# Patient Record
Sex: Female | Born: 1970 | Race: Black or African American | Hispanic: No | Marital: Married | Smoking: Never smoker
Health system: Southern US, Community
[De-identification: ages and names within clinical notes are randomized; demographics above are authoritative.]

## PROBLEM LIST (undated history)

## (undated) DIAGNOSIS — IMO0002 Reserved for concepts with insufficient information to code with codable children: Secondary | ICD-10-CM

## (undated) DIAGNOSIS — I011 Acute rheumatic endocarditis: Secondary | ICD-10-CM

## (undated) DIAGNOSIS — M3500 Sicca syndrome, unspecified: Secondary | ICD-10-CM

## (undated) DIAGNOSIS — I2699 Other pulmonary embolism without acute cor pulmonale: Secondary | ICD-10-CM

## (undated) DIAGNOSIS — M069 Rheumatoid arthritis, unspecified: Secondary | ICD-10-CM

## (undated) DIAGNOSIS — M329 Systemic lupus erythematosus, unspecified: Secondary | ICD-10-CM

## (undated) HISTORY — PX: TONSILLECTOMY: SUR1361

## (undated) HISTORY — DX: Sjogren syndrome, unspecified: M35.00

## (undated) HISTORY — DX: Systemic lupus erythematosus, unspecified: M32.9

## (undated) HISTORY — DX: Rheumatoid arthritis, unspecified: M06.9

---

## 2004-06-25 HISTORY — PX: TUBAL LIGATION: SHX77

## 2013-06-03 DIAGNOSIS — I2699 Other pulmonary embolism without acute cor pulmonale: Secondary | ICD-10-CM | POA: Insufficient documentation

## 2016-09-24 ENCOUNTER — Emergency Department (HOSPITAL_COMMUNITY)
Admission: EM | Admit: 2016-09-24 | Discharge: 2016-09-24 | Disposition: A | Discharge status: Home / Self Care | Attending: Emergency Medicine | Admitting: Emergency Medicine

## 2016-09-24 ENCOUNTER — Encounter (HOSPITAL_COMMUNITY): Payer: Self-pay | Admitting: Emergency Medicine

## 2016-09-24 DIAGNOSIS — N3000 Acute cystitis without hematuria: Secondary | ICD-10-CM | POA: Diagnosis not present

## 2016-09-24 DIAGNOSIS — Z79899 Other long term (current) drug therapy: Secondary | ICD-10-CM | POA: Diagnosis not present

## 2016-09-24 DIAGNOSIS — R350 Frequency of micturition: Secondary | ICD-10-CM | POA: Diagnosis present

## 2016-09-24 HISTORY — DX: Reserved for concepts with insufficient information to code with codable children: IMO0002

## 2016-09-24 HISTORY — DX: Systemic lupus erythematosus, unspecified: M32.9

## 2016-09-24 HISTORY — DX: Acute rheumatic endocarditis: I01.1

## 2016-09-24 HISTORY — DX: Other pulmonary embolism without acute cor pulmonale: I26.99

## 2016-09-24 LAB — URINALYSIS, ROUTINE W REFLEX MICROSCOPIC
BILIRUBIN URINE: NEGATIVE
Glucose, UA: NEGATIVE mg/dL
HGB URINE DIPSTICK: NEGATIVE
KETONES UR: NEGATIVE mg/dL
Nitrite: NEGATIVE
PROTEIN: NEGATIVE mg/dL
SPECIFIC GRAVITY, URINE: 1.024 (ref 1.005–1.030)
pH: 5 (ref 5.0–8.0)

## 2016-09-24 MED ORDER — CIPROFLOXACIN HCL 500 MG PO TABS: 500.0000 mg | ORAL_TABLET | Freq: Two times a day (BID) | ORAL | 0 refills | Status: DC

## 2016-09-24 MED ORDER — PHENAZOPYRIDINE HCL 200 MG PO TABS: 200.0000 mg | ORAL_TABLET | Freq: Three times a day (TID) | ORAL | 0 refills | Status: DC

## 2016-09-24 NOTE — ED Provider Notes (Signed)
[facility]Y DEPT Provider Note   CSN: [id number] Arrival date & time: 09/24/16  1752  By signing my name below, I, Modena Jansky, attest that this documentation has been prepared under the direction and in the presence of non-physician practitioner, Kerrie Buffalo, NP. Electronically Signed: Modena Jansky, Scribe. 09/24/2016. 7:31 PM.  History   Chief Complaint Chief Complaint  Patient presents with  . Recurrent UTI   The history is provided by the patient. No language interpreter was used.   HPI Comments: Shawna Stevens is a 46 y.o. female with a PMHx of UTI who presents to the Emergency Department complaining of urinary frequency that started about 2 days ago. She suspects a UTI due to prior of UTIs (resolved with Cipro). She reports associated urinary urgency, pressure sensation, and lower abdominal pain. She denies any dysuria or other complaints.   Past Medical History:  Diagnosis Date  . Lupus   . Pulmonary embolism (HCC)   . Rheumatoid aortitis     There are no active problems to display for this patient.   Past Surgical History:  Procedure Laterality Date  . TONSILLECTOMY      OB History    No data available       Home Medications    Prior to Admission medications   Medication Sig Start Date End Date Taking? Authorizing Provider  hydroxychloroquine (PLAQUENIL) 200 MG tablet Take 400 mg by mouth every evening.  08/27/16  Yes Historical Provider, MD  mirtazapine (REMERON) 15 MG tablet Take 15 mg by mouth at bedtime.   Yes Historical Provider, MD  Multiple Vitamin (MULTIVITAMIN WITH MINERALS) TABS tablet Take 1 tablet by mouth daily.   Yes Historical Provider, MD  nitrofurantoin (MACRODANTIN) 50 MG capsule Take 50 mg by mouth every Monday, Wednesday, and Friday. 07/31/16  Yes Historical Provider, MD    Family History History reviewed. No pertinent family history.  Social History Social History  Substance Use Topics  . Smoking status: Never Smoker  . Smokeless  tobacco: Never Used  . Alcohol use No     Allergies   Sulfa antibiotics   Review of Systems Review of Systems  Constitutional: Negative for chills and fever.  Gastrointestinal: Positive for abdominal pain (Lower).  Genitourinary: Positive for frequency and urgency. Negative for dysuria.  Musculoskeletal: Negative for back pain.  Skin: Negative for rash.  All other systems reviewed and are negative.    Physical Exam Updated Vital Signs BP 127/76 (BP Location: Right Arm)   Pulse 83   Temp 98.2 F (36.8 C) (Oral)   Resp 17   LMP 09/14/2016 (Exact Date)   SpO2 99%   Physical Exam  Constitutional: She appears well-developed and well-nourished. No distress.  HENT:  Head: Normocephalic.  Eyes: Conjunctivae are normal.  Neck: Neck supple.  Cardiovascular: Normal rate and regular rhythm.   Pulmonary/Chest: Effort normal. No respiratory distress. She has no wheezes. She has no rales.  Abdominal: Soft. Bowel sounds are normal. There is tenderness.  SP TTP.   Genitourinary:  Genitourinary Comments: No CVA tenderness.   Musculoskeletal: Normal range of motion.  Neurological: She is alert.  Skin: Skin is warm and dry.  Psychiatric: She has a normal mood and affect.  Nursing note and vitals reviewed.    ED Treatments / Results  DIAGNOSTIC STUDIES: Oxygen Saturation is 99% on RA, normal by my interpretation.    COORDINATION OF CARE: 7:35 PM- Pt advised of plan for treatment and pt agrees.  Labs (all labs ordered are listed,  but only abnormal results are displayed) Labs Reviewed  URINALYSIS, ROUTINE W REFLEX MICROSCOPIC - Abnormal; Notable for the following:       Result Value   Color, Urine AMBER (*)    APPearance HAZY (*)    Leukocytes, UA SMALL (*)    Bacteria, UA RARE (*)    Squamous Epithelial / LPF 6-30 (*)    All other components within normal limits  URINE CULTURE   Radiology No results found.  Procedures Procedures (including critical care  time)  Medications Ordered in ED Medications - No data to display   Initial Impression / Assessment and Plan / ED Course  I have reviewed the triage vital signs and the nursing notes.  Pertinent lab results that were available during my care of the patient were reviewed by me and considered in my medical decision making (see chart for details).  Pt has been diagnosed with a UTI. Pt is afebrile, no CVA tenderness, normotensive, and denies N/V. Pt to be dc home with antibiotics and instructions to follow up with PCP if symptoms persist. Urine sent for culture.  Final Clinical Impressions(s) / ED Diagnoses   Final diagnoses:  Acute cystitis without hematuria    New Prescriptions New Prescriptions   No medications on file   I personally performed the services described in this documentation, which was scribed in my presence. The recorded information has been reviewed and is accurate.     [address] [place], [place] 09/24/16 2056    Mancel Bale, MD 09/25/16 0157

## 2016-09-24 NOTE — ED Notes (Signed)
Pt ambulatory to waiting room. Pt verbalized understanding of discharge instructions.   

## 2016-09-24 NOTE — ED Triage Notes (Signed)
Pt states "Ishe has a history of recurrent UTIs and this feels like the others." Pt C/O of nausea X 2 days.

## 2016-09-27 LAB — URINE CULTURE

## 2016-09-28 ENCOUNTER — Telehealth: Payer: Self-pay

## 2016-09-28 NOTE — Telephone Encounter (Signed)
Post ED Visit - Positive Culture Follow-up  Culture report reviewed by antimicrobial stewardship pharmacist:   Enzo Bi, Pharm.D.  Celedonio Miyamoto, Pharm.D., BCPS AQ-ID  Garvin Fila, Pharm.D., BCPS  Georgina Pillion, Pharm.D., BCPS  [place], [address].D., BCPS, AAHIVP  Estella Husk, Pharm.D., BCPS, AAHIVP  Lysle Pearl, PharmD, BCPS  Casilda Carls, PharmD, BCPS  Pollyann Samples, PharmD, BCPS Katie cook Pharm D Positive urine culture Treated with Ciprofloxacin, organism sensitive to the same and no further patient follow-up is required at this time.  Jerry Caras 09/28/2016, 9:12 AM

## 2016-10-08 DIAGNOSIS — M0579 Rheumatoid arthritis with rheumatoid factor of multiple sites without organ or systems involvement: Secondary | ICD-10-CM | POA: Insufficient documentation

## 2016-10-08 DIAGNOSIS — M329 Systemic lupus erythematosus, unspecified: Secondary | ICD-10-CM | POA: Insufficient documentation

## 2016-10-08 DIAGNOSIS — Z86711 Personal history of pulmonary embolism: Secondary | ICD-10-CM | POA: Insufficient documentation

## 2016-10-08 NOTE — Progress Notes (Deleted)
   Office Visit Note  Patient: Shawna Stevens             Date of Birth: 05/20/1971           MRN: [id number]             PCP: No PCP Per Patient Referring: No ref. provider found Visit Date: 10/12/2016 Occupation: @GUAROCC@    Subjective:  No chief complaint on file.   History of Present Illness: Shawna Stevens is a 46 y.o. female ***   Activities of Daily Living:  Patient reports morning stiffness for *** {minute/hour:19697}.   Patient {ACTIONS;DENIES/REPORTS:21021675::"Denies"} nocturnal pain.  Difficulty dressing/grooming: {ACTIONS;DENIES/REPORTS:21021675::"Denies"} Difficulty climbing stairs: {ACTIONS;DENIES/REPORTS:21021675::"Denies"} Difficulty getting out of chair: {ACTIONS;DENIES/REPORTS:21021675::"Denies"} Difficulty using hands for taps, buttons, cutlery, and/or writing: {ACTIONS;DENIES/REPORTS:21021675::"Denies"}   No Rheumatology ROS completed.   PMFS History:  There are no active problems to display for this patient.   Past Medical History:  Diagnosis Date  . Lupus   . Pulmonary embolism (HCC)   . Rheumatoid aortitis     No family history on file. Past Surgical History:  Procedure Laterality Date  . TONSILLECTOMY     Social History   Social History Narrative  . No narrative on file     Objective: Vital Signs: LMP 09/14/2016 (Exact Date)    Physical Exam   Musculoskeletal Exam: ***  CDAI Exam: No CDAI exam completed.    Investigation: Findings:  04/20/2016 CBC normal, CMP normal alkaline phosphatase 125, C3 1 21, C4 normal 20, CH 14 normal 46, UA negative, ESR 14, CRP 4.6 normal, RF 67.8, CCP antibody greater than 250, 1433 eta negative, ANA 1:160 homogeneous, DS DNA 16, RNP positive, Ro positive, Smith negative, La negative, hep panel negative, TB Gold indeterminate    Imaging: No results found.  Speciality Comments: No specialty comments available.    Procedures:  No procedures performed Allergies: Sulfa antibiotics   Assessment /  Plan:     Visit Diagnoses: No diagnosis found.    Orders: No orders of the defined types were placed in this encounter.  No orders of the defined types were placed in this encounter.   Face-to-face time spent with patient was *** minutes. 50% of time was spent in counseling and coordination of care.  Follow-Up Instructions: No Follow-up on file.    , MD  Note - This record has been created using Dragon software.  Chart creation errors have been sought, but may not always  have been located. Such creation errors do not reflect on  the standard of medical care. 

## 2016-10-12 ENCOUNTER — Ambulatory Visit: Payer: Self-pay | Admitting: Rheumatology

## 2016-10-21 ENCOUNTER — Encounter (HOSPITAL_COMMUNITY): Payer: Self-pay | Admitting: *Deleted

## 2016-10-21 ENCOUNTER — Emergency Department (HOSPITAL_COMMUNITY)
Admission: EM | Admit: 2016-10-21 | Discharge: 2016-10-21 | Disposition: A | Discharge status: Home / Self Care | Attending: Emergency Medicine | Admitting: Emergency Medicine

## 2016-10-21 DIAGNOSIS — R112 Nausea with vomiting, unspecified: Secondary | ICD-10-CM

## 2016-10-21 DIAGNOSIS — R197 Diarrhea, unspecified: Secondary | ICD-10-CM | POA: Insufficient documentation

## 2016-10-21 DIAGNOSIS — Z79899 Other long term (current) drug therapy: Secondary | ICD-10-CM | POA: Diagnosis not present

## 2016-10-21 DIAGNOSIS — R109 Unspecified abdominal pain: Secondary | ICD-10-CM | POA: Insufficient documentation

## 2016-10-21 LAB — CBC WITH DIFFERENTIAL/PLATELET
BASOS ABS: 0 10*3/uL (ref 0.0–0.1)
BASOS PCT: 1 %
Eosinophils Absolute: 0.1 10*3/uL (ref 0.0–0.7)
Eosinophils Relative: 2 %
HEMATOCRIT: 38.1 % (ref 36.0–46.0)
HEMOGLOBIN: 12.8 g/dL (ref 12.0–15.0)
Lymphocytes Relative: 38 %
Lymphs Abs: 2 10*3/uL (ref 0.7–4.0)
MCH: 29.4 pg (ref 26.0–34.0)
MCHC: 33.6 g/dL (ref 30.0–36.0)
MCV: 87.6 fL (ref 78.0–100.0)
MONOS PCT: 11 %
Monocytes Absolute: 0.6 10*3/uL (ref 0.1–1.0)
NEUTROS ABS: 2.5 10*3/uL (ref 1.7–7.7)
NEUTROS PCT: 48 %
Platelets: 213 10*3/uL (ref 150–400)
RBC: 4.35 MIL/uL (ref 3.87–5.11)
RDW: 13.6 % (ref 11.5–15.5)
WBC: 5.2 10*3/uL (ref 4.0–10.5)

## 2016-10-21 LAB — BASIC METABOLIC PANEL
ANION GAP: 8 (ref 5–15)
BUN: 11 mg/dL (ref 6–20)
CHLORIDE: 105 mmol/L (ref 101–111)
CO2: 24 mmol/L (ref 22–32)
Calcium: 9.2 mg/dL (ref 8.9–10.3)
Creatinine, Ser: 0.73 mg/dL (ref 0.44–1.00)
GFR calc non Af Amer: >60 mL/min (ref >60)
Glucose, Bld: 95 mg/dL (ref 65–99)
Potassium: 3.6 mmol/L (ref 3.5–5.1)
Sodium: 137 mmol/L (ref 135–145)

## 2016-10-21 LAB — URINALYSIS, ROUTINE W REFLEX MICROSCOPIC
Bilirubin Urine: NEGATIVE
Glucose, UA: NEGATIVE mg/dL
Hgb urine dipstick: NEGATIVE
KETONES UR: NEGATIVE mg/dL
LEUKOCYTES UA: NEGATIVE
NITRITE: NEGATIVE
PH: 5 (ref 5.0–8.0)
Protein, ur: NEGATIVE mg/dL
SPECIFIC GRAVITY, URINE: 1.027 (ref 1.005–1.030)

## 2016-10-21 LAB — PREGNANCY, URINE: PREG TEST UR: NEGATIVE

## 2016-10-21 MED ORDER — ONDANSETRON HCL 4 MG/2ML IJ SOLN
4.0000 mg | Freq: Once | INTRAMUSCULAR | Status: AC
  Administered 2016-10-21: 4 mg via INTRAVENOUS
  Filled 2016-10-21: qty 2

## 2016-10-21 MED ORDER — ONDANSETRON 4 MG PO TBDP: 4.0000 mg | ORAL_TABLET | Freq: Three times a day (TID) | ORAL | 0 refills | Status: DC | PRN

## 2016-10-21 MED ORDER — SODIUM CHLORIDE 0.9 % IV BOLUS (SEPSIS)
1000.0000 mL | Freq: Once | INTRAVENOUS | Status: AC
  Administered 2016-10-21: 1000 mL via INTRAVENOUS

## 2016-10-21 MED ORDER — MIRTAZAPINE 15 MG PO TABS: 15.0000 mg | ORAL_TABLET | Freq: Every day | ORAL | 0 refills | Status: DC

## 2016-10-21 NOTE — ED Triage Notes (Signed)
Pt c/o nausea, abd cramping and diarrhea that started a few days ago,

## 2016-10-21 NOTE — ED Provider Notes (Signed)
[facility] Provider Note   CSN: [id number] Arrival date & time: 10/21/16  1908     History   Chief Complaint No chief complaint on file.   HPI Shawna Stevens is a 46 y.o. female.  HPI Patient has nausea vomiting diarrhea and abdominal pain. Began a few days ago. Decreased appetite. No sick contacts. No fevers. States she just feels bad. Pain is dull. Worse in the lower abdomen. States the pain was much more severe couple days ago. No dysuria. No vaginal bleeding or discharge. Has had previous urinary tract infections but states this does not feel like that. Also antibiotics for UTI around a month ago. Pain is worse with eating.   Past Medical History:  Diagnosis Date  . Lupus   . Pulmonary embolism (HCC)   . Rheumatoid aortitis     Patient Active Problem List   Diagnosis Date Noted  . Rheumatoid arthritis involving multiple sites with positive rheumatoid factor (HCC) 10/08/2016  . Systemic lupus erythematosus (HCC) 10/08/2016  . History of pulmonary embolism 10/08/2016    Past Surgical History:  Procedure Laterality Date  . TONSILLECTOMY      OB History    No data available       Home Medications    Prior to Admission medications   Medication Sig Start Date End Date Taking? Authorizing Provider  hydroxychloroquine (PLAQUENIL) 200 MG tablet Take 200 mg by mouth 2 (two) times daily.    Yes Historical Provider, MD  nitrofurantoin (MACRODANTIN) 50 MG capsule Take 50 mg by mouth every Monday, Wednesday, and Friday.   Yes Historical Provider, MD  mirtazapine (REMERON) 15 MG tablet Take 1 tablet (15 mg total) by mouth at bedtime. 10/21/16   Benjiman Core, MD  ondansetron (ZOFRAN-ODT) 4 MG disintegrating tablet Take 1 tablet (4 mg total) by mouth every 8 (eight) hours as needed for nausea or vomiting. 10/21/16   Benjiman Core, MD    Family History No family history on file.  Social History Social History  Substance Use Topics  . Smoking status: Never  Smoker  . Smokeless tobacco: Never Used  . Alcohol use No     Allergies   Sulfa antibiotics   Review of Systems Review of Systems  Constitutional: Positive for appetite change.  HENT: Negative for dental problem.   Respiratory: Negative for shortness of breath.   Gastrointestinal: Positive for abdominal pain, diarrhea, nausea and vomiting.  Genitourinary: Negative for dysuria, flank pain, pelvic pain, vaginal bleeding and vaginal discharge.  Musculoskeletal: Negative for back pain.  Skin: Negative for rash and wound.  Neurological: Negative for seizures.  Hematological: Negative for adenopathy.  Psychiatric/Behavioral: Negative for confusion.     Physical Exam Updated Vital Signs BP (!) 144/82   Pulse 86   Temp 99.3 F (37.4 C) (Oral)   Resp 20   Ht  (1.702 m)   Wt 180 lb (81.6 kg)   LMP 10/06/2016   SpO2 99%   BMI 28.19 kg/m   Physical Exam  Constitutional: She appears well-developed.  HENT:  Head: Atraumatic.  Eyes: Pupils are equal, round, and reactive to light.  Neck: Neck supple.  Cardiovascular: Normal rate.   Pulmonary/Chest: Effort normal.  Abdominal: Soft. There is no tenderness.  Musculoskeletal: She exhibits no edema.  Neurological: She is alert.  Skin: Skin is warm. Capillary refill takes less than 2 seconds.  Psychiatric: She has a normal mood and affect.     ED Treatments / Results  Labs (all labs ordered  are listed, but only abnormal results are displayed) Labs Reviewed  URINALYSIS, ROUTINE W REFLEX MICROSCOPIC - Abnormal; Notable for the following:       Result Value   APPearance HAZY (*)    All other components within normal limits  CBC WITH DIFFERENTIAL/PLATELET  BASIC METABOLIC PANEL  PREGNANCY, URINE    EKG  EKG Interpretation None       Radiology No results found.  Procedures Procedures (including critical care time)  Medications Ordered in ED Medications  sodium chloride 0.9 % bolus 1,000 mL (0 mLs  Intravenous Stopped 10/21/16 2058)  ondansetron (ZOFRAN) injection 4 mg (4 mg Intravenous Given 10/21/16 1956)     Initial Impression / Assessment and Plan / ED Course  I have reviewed the triage vital signs and the nursing notes.  Pertinent labs & imaging results that were available during my care of the patient were reviewed by me and considered in my medical decision making (see chart for details).     Patient nausea vomiting abdominal pain. Benign exam. Labs reassuring. Has tolerated orals here. Urine does not show infection. Patient also asked for refill of Remeron. Has been out of it for a week. Withdrawal could have something to do with the symptoms also. Did refill prescription for 14 pills. Will discharge home.  Final Clinical Impressions(s) / ED Diagnoses   Final diagnoses:  Non-intractable vomiting with nausea, unspecified vomiting type    New Prescriptions New Prescriptions   MIRTAZAPINE (REMERON) 15 MG TABLET    Take 1 tablet (15 mg total) by mouth at bedtime.   ONDANSETRON (ZOFRAN-ODT) 4 MG DISINTEGRATING TABLET    Take 1 tablet (4 mg total) by mouth every 8 (eight) hours as needed for nausea or vomiting.     Benjiman Core, MD 10/21/16 2116

## 2016-11-12 ENCOUNTER — Ambulatory Visit: Payer: Self-pay | Admitting: Rheumatology

## 2016-11-29 DIAGNOSIS — Z79899 Other long term (current) drug therapy: Secondary | ICD-10-CM | POA: Insufficient documentation

## 2016-11-29 DIAGNOSIS — L568 Other specified acute skin changes due to ultraviolet radiation: Secondary | ICD-10-CM | POA: Insufficient documentation

## 2016-11-29 DIAGNOSIS — M351 Other overlap syndromes: Secondary | ICD-10-CM | POA: Insufficient documentation

## 2016-11-29 DIAGNOSIS — L659 Nonscarring hair loss, unspecified: Secondary | ICD-10-CM | POA: Insufficient documentation

## 2016-11-29 DIAGNOSIS — R5383 Other fatigue: Secondary | ICD-10-CM | POA: Insufficient documentation

## 2016-11-29 NOTE — Progress Notes (Signed)
Office Visit Note  Patient: Shawna Stevens             Date of Birth: 1971-06-01           MRN: [id number]             PCP: Patient, No Pcp Per Referring: No ref. provider found Visit Date: 12/04/2016 Occupation: @GUAROCC @    Subjective:  Pain of the Right Hand; Pain of the Right Wrist; New Patient (Initial Visit); and Insomnia   History of Present Illness: Shawna Stevens is a 46 y.o. female with history of rheumatoid arthritis and lupus overlap. She she self-referred herself for evaluation of her rheumatoid arthritis. According to patient when she was 46 years old and she was pregnant she was living in [place]. 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 She had extensive workup including bone marrow and HIV test. She states it discussed possible splenectomy. In the meantime she developed arthritis and hair loss and she was diagnosed with systemic lupus erythematosus. She recalls seeing a rheumatologist prescribed her prednisone for a few months. Later she was switched to Plaquenil. She recalls being on anti-inflammatories for long time and then was started on Plaquenil in 2005. She states she took prednisone when necessary for flares for short periods. She also went to emergency room several times for swollen joints. She was on methotrexate about a year ago she states that overall methotrexate caused nausea and she was switched to injectable methotrexate. In the meantime she switched her physicians and the new physician prescribed her Enbrel. She states she never took Enbrel because she developed urinary tract infection. She moved to [place] in January 2018 and has not seen a rheumatologist yet. She states she continues to have more of morning stiffness lasting for about an hour and she has ongoing swelling in her right wrist joint. She has occasional knee joint discomfort. She continues to have hair loss photosensitivity. She denies any Raynaud's phenomena and currently.  She also gives history of pulmonary embolism in 2014 she states it developed after 5 hours of travel. She was on anticoagulant for about an year and then discontinued. She is gravida 4 para4 status post tubal ligation. There is no family history of autoimmune disease.  Activities of Daily Living:  Patient reports morning stiffness for1 hour.   Patient Denies nocturnal pain.  Difficulty dressing/grooming: Denies Difficulty climbing stairs: Denies Difficulty getting out of chair: Denies Difficulty using hands for taps, buttons, cutlery, and/or writing: Reports   Review of Systems  Constitutional: Positive for fatigue. Negative for night sweats, weight gain, weight loss and weakness.  HENT: Positive for mouth sores. Negative for trouble swallowing, trouble swallowing, mouth dryness and nose dryness.   Eyes: Negative for pain, redness, visual disturbance and dryness.  Respiratory: Negative for cough, shortness of breath and difficulty breathing.   Cardiovascular: Negative for chest pain, palpitations, hypertension, irregular heartbeat and swelling in legs/feet.  Gastrointestinal: Negative for blood in stool, constipation and diarrhea.  Endocrine: Negative for increased urination.  Genitourinary: Negative for vaginal dryness.  Musculoskeletal: Positive for arthralgias, joint pain, joint swelling and morning stiffness. Negative for myalgias, muscle weakness, muscle tenderness and myalgias.  Skin: Positive for rash, hair loss and sensitivity to sunlight. Negative for color change, skin tightness and ulcers.  Allergic/Immunologic: Negative for susceptible to infections.  Neurological: Negative for dizziness, memory loss and night sweats.  Hematological: Negative for swollen glands.  Psychiatric/Behavioral: Negative for depressed mood and sleep disturbance.  The patient is not nervous/anxious.     PMFS History:  Patient Active Problem List   Diagnosis Date Noted  . MCTD (mixed connective tissue  disease) (HCC) 11/29/2016  . Other fatigue 11/29/2016  . Alopecia 11/29/2016  . Photosensitivity 11/29/2016  . High risk medication use 11/29/2016  . Rheumatoid arthritis involving multiple sites with positive rheumatoid factor (HCC) 10/08/2016  . Systemic lupus erythematosus (HCC) 10/08/2016  . History of pulmonary embolism 10/08/2016    Past Medical History:  Diagnosis Date  . Lupus   . Pulmonary embolism (HCC)   . Rheumatoid aortitis     No family history on file. Past Surgical History:  Procedure Laterality Date  . TONSILLECTOMY     Social History   Social History Narrative  . No narrative on file     Objective: Vital Signs: BP 136/84 (BP Location: Right Arm)   Pulse 78   Ht 5\' 8"  (1.727 m)   Wt 186 lb (84.4 kg)   BMI 28.28 kg/m    Physical Exam  Constitutional: She is oriented to person, place, and time. She appears well-developed and well-nourished.  HENT:  Head: Normocephalic and atraumatic.  Eyes: Conjunctivae and EOM are normal.  Neck: Normal range of motion.  Cardiovascular: Normal rate, regular rhythm, normal heart sounds and intact distal pulses.   Pulmonary/Chest: Effort normal and breath sounds normal.  Abdominal: Soft. Bowel sounds are normal.  Lymphadenopathy:    She has no cervical adenopathy.  Neurological: She is alert and oriented to person, place, and time.  Skin: Skin is warm and dry. Capillary refill takes less than 2 seconds.  Psychiatric: She has a normal mood and affect. Her behavior is normal.  Nursing note and vitals reviewed.    Musculoskeletal Exam: C-spine and thoracic lumbar spine good range of motion. Shoulder joints and elbow joints are good range of motion. Right wrist joint had swelling synovitis and limited range of motion. Left wrist joint and bilateral MCPs PIPs DIPs are good range of motion with no synovitis. Hip joints knee joints ankles MTPs PIPs with good range of motion with no synovitis. She has slight warmth on  palpation of her right knee joint.  CDAI Exam: CDAI Homunculus Exam:   Tenderness:  RUE: wrist  Swelling:  RUE: wrist  Joint Counts:  CDAI Tender Joint count: 1 CDAI Swollen Joint count: 1  Global Assessments:  Patient Global Assessment: 8 Provider Global Assessment: 4  CDAI Calculated Score: 14    Investigation: Findings:  04/26/2016 indeterminate TB gold  04/20/2016 C4 normal 20, CMP normal, Hepatitis Panel Acute negative, 14 3 3  negative,  04/12/2016 Sed Rate 14, C3 normal 121,  RF 67.8, CRP 4.6, CBC normal, CCP greater than 250, dsDNA positive 16, Smith, RNP, Ro,La are all negative ANA positive 1:160 titer, Homogeneous pattern, and U/A normal    Imaging: Xr Foot 2 Views Left  Result Date: 12/04/2016 Minimal PIP/DIP joint space narrowing was noted. No MTP joint space narrowing was noted. No erosive changes were noted. The metatarsal tarsal or intertarsal joint space narrowing was noted. No erosive changes were noted. Impression: Normal x-ray of the foot  Xr Foot 2 Views Right  Result Date: 12/04/2016 Right first MTP, all PIP, all DIP narrowing was noted. No other MTP joint space narrowing was noted. No erosive changes were noted. No metatarsal tarsal, intertarsal joint space narrowing was noted. Impression mild osteoarthritic changes were noted in the foot.  Xr Hand 2 View Left  Result Date: 12/04/2016 Juxta  articular osteopenia noted, left second MCP narrowing, minimal PIP DIP narrowing, mild intercarpal radiocarpal joint space narrowing, no erosive changes were noted. These findings were consistent with rheumatoid arthritis.  Xr Hand 2 View Right  Result Date: 12/04/2016 Juxta articular osteopenia noted. Right second MCP and first MCP narrowing was noted. Intercarpal, metacarpocarpal and radiocarpal joint space narrowing was noted. No erosive changes were noted. Impression: These findings were consistent with inflammatory arthritis.   Speciality Comments: No  specialty comments available.    Procedures:  No procedures performed Allergies: Sulfa antibiotics   Assessment / Plan:     Visit Diagnoses: Rheumatoid arthritis involving multiple sites with positive rheumatoid factor (HCC) - Positive RF, positive anti-CCP. She has synovitis in her right wrist joint with decreased range of motion. She's having a lot of pain and discomfort. She's not taking any medications right now. I'll give her a prednisone taper starting at 20 mg and taper by 5 mg every week. We will also obtain following labs today once we have labs available we will start her on Plaquenil and methotrexate. She's taken subcutaneous methotrexate in the past. I'll obtain a baseline chest x-ray today. She'll be advised to get pneumococcal vaccine. - Plan: XR Hand 2 View Right, XR Hand 2 View Left, XR Foot 2 Views Left, XR Foot 2 Views Right  Other systemic lupus erythematosus with other organ involvement (HCC) - Positive ANA, positive dsDNA, arthritis, fatigue, photosensitivity, oral ulcers. I'll obtain additional labs today.  History of pulmonary embolism: She developed symptoms after being on Motrin for 5 hours in the past. She  was on anticoagulant therapy for one year. She's had been advised to take a baby aspirin every day.  Other fatigue  Photosensitivity: Use of sunscreen was discussed.  High risk medication use - PLQ 200mg  po bid   Bilateral hand pain - Plan: XR Hand 2 View Right, XR Hand 2 View Left  Foot pain, bilateral - Plan: XR Foot 2 Views Left, XR Foot 2 Views Right    Orders: Orders Placed This Encounter  Procedures  . XR Hand 2 View Right  . XR Hand 2 View Left  . XR Foot 2 Views Left  . XR Foot 2 Views Right  . DG Chest 2 View  . CBC with Differential/Platelet  . COMPLETE METABOLIC PANEL WITH GFR  . Urinalysis, Routine w reflex microscopic  . Sedimentation rate  . Antinuclear Antib (ANA)  . Anti-DNA antibody, double-stranded  . C3 and C4  . Cardiolipin  antibodies, IgG, IgM, IgA  . Lupus anticoagulant panel  . Beta-2 glycoprotein antibodies  . Glucose 6 phosphate dehydrogenase  . Quantiferon tb gold assay (blood)  . Hepatitis panel, acute  . Serum protein electrophoresis with reflex  . IgG, IgA, IgM  . HIV antibody  . VITAMIN D 25 Hydroxy (Vit-D Deficiency, Fractures)   Meds ordered this encounter  Medications  . predniSONE (DELTASONE) 5 MG tablet    Sig: 20 mg PO QD x7 days, 15 mg PO QD x7 days, 10 mg PO QD x7 days, 7.5 mg PO QD x7 days, 5 mg PO QD x 7 days, 2.5 mg PO QD x7 days    Dispense:  84 tablet    Refill:  0    Face-to-face time spent with patient was 60 minutes. 50% of time was spent in counseling and coordination of care.  Follow-Up Instructions: Return for Rheumatoid arthritis, Systemic lupus.   Pollyann Savoy, MD  Note - This record has been created  using Editor, commissioning.  Chart creation errors have been sought, but may not always  have been located. Such creation errors do not reflect on  the standard of medical care.

## 2016-12-04 ENCOUNTER — Ambulatory Visit (INDEPENDENT_AMBULATORY_CARE_PROVIDER_SITE_OTHER)

## 2016-12-04 ENCOUNTER — Ambulatory Visit (INDEPENDENT_AMBULATORY_CARE_PROVIDER_SITE_OTHER): Admitting: Rheumatology

## 2016-12-04 ENCOUNTER — Ambulatory Visit (HOSPITAL_COMMUNITY)
Admission: RE | Admit: 2016-12-04 | Discharge: 2016-12-04 | Disposition: A | Discharge status: Home / Self Care | Source: Ambulatory Visit | Attending: Rheumatology | Admitting: Rheumatology

## 2016-12-04 ENCOUNTER — Encounter: Payer: Self-pay | Admitting: Rheumatology

## 2016-12-04 VITALS — BP 136/84 | HR 78 | Ht 68.0 in | Wt 186.0 lb

## 2016-12-04 DIAGNOSIS — M3219 Other organ or system involvement in systemic lupus erythematosus: Secondary | ICD-10-CM

## 2016-12-04 DIAGNOSIS — M79641 Pain in right hand: Secondary | ICD-10-CM

## 2016-12-04 DIAGNOSIS — Z111 Encounter for screening for respiratory tuberculosis: Secondary | ICD-10-CM

## 2016-12-04 DIAGNOSIS — M79672 Pain in left foot: Secondary | ICD-10-CM | POA: Diagnosis not present

## 2016-12-04 DIAGNOSIS — M79671 Pain in right foot: Secondary | ICD-10-CM

## 2016-12-04 DIAGNOSIS — Z114 Encounter for screening for human immunodeficiency virus [HIV]: Secondary | ICD-10-CM | POA: Diagnosis not present

## 2016-12-04 DIAGNOSIS — M0579 Rheumatoid arthritis with rheumatoid factor of multiple sites without organ or systems involvement: Secondary | ICD-10-CM | POA: Diagnosis not present

## 2016-12-04 DIAGNOSIS — R5383 Other fatigue: Secondary | ICD-10-CM | POA: Diagnosis not present

## 2016-12-04 DIAGNOSIS — Z79899 Other long term (current) drug therapy: Secondary | ICD-10-CM

## 2016-12-04 DIAGNOSIS — Z86711 Personal history of pulmonary embolism: Secondary | ICD-10-CM

## 2016-12-04 DIAGNOSIS — L568 Other specified acute skin changes due to ultraviolet radiation: Secondary | ICD-10-CM

## 2016-12-04 DIAGNOSIS — M79642 Pain in left hand: Secondary | ICD-10-CM

## 2016-12-04 DIAGNOSIS — Z1159 Encounter for screening for other viral diseases: Secondary | ICD-10-CM

## 2016-12-04 LAB — CBC WITH DIFFERENTIAL/PLATELET
Basophils Absolute: 0 cells/uL (ref 0–200)
Basophils Relative: 0 %
EOS PCT: 4 %
Eosinophils Absolute: 200 cells/uL (ref 15–500)
HCT: 39 % (ref 35.0–45.0)
Hemoglobin: 13 g/dL (ref 11.7–15.5)
LYMPHS PCT: 40 %
Lymphs Abs: 2000 cells/uL (ref 850–3900)
MCH: 29.7 pg (ref 27.0–33.0)
MCHC: 33.3 g/dL (ref 32.0–36.0)
MCV: 89 fL (ref 80.0–100.0)
MPV: 13 fL — AB (ref 7.5–12.5)
Monocytes Absolute: 450 cells/uL (ref 200–950)
Monocytes Relative: 9 %
NEUTROS PCT: 47 %
Neutro Abs: 2350 cells/uL (ref 1500–7800)
Platelets: 221 10*3/uL (ref 140–400)
RBC: 4.38 MIL/uL (ref 3.80–5.10)
RDW: 14.5 % (ref 11.0–15.0)
WBC: 5 10*3/uL (ref 3.8–10.8)

## 2016-12-04 MED ORDER — PREDNISONE 5 MG PO TABS: ORAL_TABLET | ORAL | 0 refills | Status: DC

## 2016-12-04 NOTE — Progress Notes (Signed)
Pharmacy Note  Subjective: Patient presents today to the [facility] to see Dr. Corliss Skainseveshwar.  Patient is currently taking hydroxychloroquine 200 mg by mouth twice daily (4.74 mg/kg) and is tolerating the medication well.  Decision was made to continue hydroxychloroquine and start patient on methotrexate once her labs return.  Patient seen by the pharmacist for counseling on hydroxychloroquine and methotrexate.    Objective: CMP Latest Ref Rng & Units 10/21/2016  Glucose 65 - 99 mg/dL 95  BUN 6 - 20 mg/dL 11  Creatinine 1.610.44 - 0.961.00 mg/dL 0.450.73  Sodium 409135 - 811145 mmol/L 137  Potassium 3.5 - 5.1 mmol/L 3.6  Chloride 101 - 111 mmol/L 105  CO2 22 - 32 mmol/L 24  Calcium 8.9 - 10.3 mg/dL 9.2   CBC    Component Value Date/Time   WBC 5.2 10/21/2016 1954   RBC 4.35 10/21/2016 1954   HGB 12.8 10/21/2016 1954   HCT 38.1 10/21/2016 1954   PLT 213 10/21/2016 1954   MCV 87.6 10/21/2016 1954   MCH 29.4 10/21/2016 1954   MCHC 33.6 10/21/2016 1954   RDW 13.6 10/21/2016 1954   LYMPHSABS 2.0 10/21/2016 1954   MONOABS 0.6 10/21/2016 1954   EOSABS 0.1 10/21/2016 1954   BASOSABS 0.0 10/21/2016 1954   TB Gold: ordered today Hepatitis panel: ordered today HIV: ordered today  Chest-xray:  Ordered today  Contraception: tubal ligation  Alcohol use: denies  Assessment/Plan: Patient was counseled on the purpose, proper use, and adverse effects of hydroxychloroquine including nausea/diarrhea, skin rash, headaches, and sun sensitivity.  Discussed importance of annual eye exams while on hydroxychloroquine to monitor to ocular toxicity and discussed importance of frequent laboratory monitoring.  Provided patient with eye exam form for baseline ophthalmologic exam and standing lab instructions.  Provided patient with educational materials on hydroxychloroquine and answered all questions.  Patient consented to hydroxychloroquine.  Will upload consent in the media tab.    Patient was counseled on  the purpose, proper use, and adverse effects of methotrexate including nausea, infection, and signs and symptoms of pneumonitis.  Reviewed instructions with patient to take methotrexate subcutaneous weekly along with folic acid daily.  Discussed the importance of frequent monitoring of kidney and liver function and blood counts, and provided patient with standing lab instructions.  Counseled patient to avoid NSAIDs and alcohol while on methotrexate.  Provided patient with educational materials on methotrexate and answered all questions.  Advised patient to get annual influenza vaccine and to get a pneumococcal vaccine if patient has not already had one.  Patient voiced understanding.  Patient consented to methotrexate use.  Will plan to initiate methotrexate once labs return.  Educated patient on how to use a vial and syringe and reviewed injection technique with patient.  Patient confirms she has used vial and syringe in the past.  Provided patient on educational material regarding injection technique and storage of methotrexate.    Patient was also prescribed prednisone taper.  Counseled patient on purpose, proper use, and adverse effects of prednisone taper.    Lilla Shookachel Alfonso Shackett, Pharm.D., BCPS Clinical Pharmacist Pager: [phone] Phone: [phone] 12/04/2016 8:38 AM

## 2016-12-04 NOTE — Patient Instructions (Addendum)
Prednisone taper using 5 mg tablets:  Take prednisone 20 mg (4 tablets) by mouth daily for one week, then 15 mg (3 tablets) daily for one week, then 10 mg (2 tablets) daily for one week, then 7.5 mg (1 and 1/2 tablet) daily for one week, then 5 mg (1 tablet) daily for one week, then 2.5 mg daily (1/2 tablet) daily for one week  We recommend a pneumonia vaccine.  Please discuss this with your primary care provider.    Please go to [facility] Radiology department to get a chest x-ray.    Start taking aspirin 81 mg by mouth daily.    If your labs are normal, we plan to start you on methotrexate 0.6 mL subcutaneous weekly for two weeks.  Then get standing labs.  If labs are stable, we will increase to methotrexate 0.8 mL subcutaneous weekly.  Get labs again two weeks after increasing methotrexate dose.  We want you to take folic acid 2 mg by mouth daily with methotrexate.    Standing Labs We placed an order today for your standing lab work.    Please come back and get your standing labs every 2 weeks x 2, then every 2 months  We have open lab Monday through Friday from 8:30-11:30 AM and 1:30-4 PM at the office of Dr. Arbutus Ped, PA.   The office is located at [address], [place], [place] [address] No appointment is necessary.   Labs are drawn by [employer] [facility] [employer].  You may receive a bill from [facility] for your lab work. If you have any questions regarding directions or hours of operation,  please call [phone].    Methotrexate tablets What is this medicine? METHOTREXATE (METH oh TREX ate) is a chemotherapy drug used to treat cancer including breast cancer, leukemia, and lymphoma. This medicine can also be used to treat psoriasis and certain kinds of arthritis. This medicine may be used for other purposes; ask your health care provider or pharmacist if you have questions. COMMON BRAND NAME(S): Rheumatrex, Trexall What should I tell my health care provider before I  take this medicine? They need to know if you have any of these conditions: -fluid in the stomach area or lungs -if you often drink alcohol -infection or immune system problems -kidney disease or on hemodialysis -liver disease -low blood counts, like low white cell, platelet, or red cell counts -lung disease -radiation therapy -stomach ulcers -ulcerative colitis -an unusual or allergic reaction to methotrexate, other medicines, foods, dyes, or preservatives -pregnant or trying to get pregnant -breast-feeding How should I use this medicine? Take this medicine by mouth with a glass of water. Follow the directions on the prescription label. Take your medicine at regular intervals. Do not take it more often than directed. Do not stop taking except on your doctor's advice. Make sure you know why you are taking this medicine and how often you should take it. If this medicine is used for a condition that is not cancer, like arthritis or psoriasis, it should be taken weekly, NOT daily. Taking this medicine more often than directed can cause serious side effects, even death. Talk to your healthcare provider about safe handling and disposal of this medicine. You may need to take special precautions. Talk to your pediatrician regarding the use of this medicine in children. While this drug may be prescribed for selected conditions, precautions do apply. Overdosage: If you think you have taken too much of this medicine contact a poison control  center or emergency room at once. NOTE: This medicine is only for you. Do not share this medicine with others. What if I miss a dose? If you miss a dose, talk with your doctor or health care professional. Do not take double or extra doses. What may interact with this medicine? This medicine may interact with the following medication: -acitretin -aspirin and aspirin-like medicines including salicylates -azathioprine -certain antibiotics like penicillins,  tetracycline, and chloramphenicol -cyclosporine -gold -hydroxychloroquine -live virus vaccines -NSAIDs, medicines for pain and inflammation, like ibuprofen or naproxen -other cytotoxic agents -penicillamine -phenylbutazone -phenytoin -probenecid -retinoids such as isotretinoin and tretinoin -steroid medicines like prednisone or cortisone -sulfonamides like sulfasalazine and trimethoprim/sulfamethoxazole -theophylline This list may not describe all possible interactions. Give your health care provider a list of all the medicines, herbs, non-prescription drugs, or dietary supplements you use. Also tell them if you smoke, drink alcohol, or use illegal drugs. Some items may interact with your medicine. What should I watch for while using this medicine? Avoid alcoholic drinks. This medicine can make you more sensitive to the sun. Keep out of the sun. If you cannot avoid being in the sun, wear protective clothing and use sunscreen. Do not use sun lamps or tanning beds/booths. You may need blood work done while you are taking this medicine. Call your doctor or health care professional for advice if you get a fever, chills or sore throat, or other symptoms of a cold or flu. Do not treat yourself. This drug decreases your body's ability to fight infections. Try to avoid being around people who are sick. This medicine may increase your risk to bruise or bleed. Call your doctor or health care professional if you notice any unusual bleeding. Check with your doctor or health care professional if you get an attack of severe diarrhea, nausea and vomiting, or if you sweat a lot. The loss of too much body fluid can make it dangerous for you to take this medicine. Talk to your doctor about your risk of cancer. You may be more at risk for certain types of cancers if you take this medicine. Both men and women must use effective birth control with this medicine. Do not become pregnant while taking this medicine  or until at least 1 normal menstrual cycle has occurred after stopping it. Women should inform their doctor if they wish to become pregnant or think they might be pregnant. Men should not father a child while taking this medicine and for 3 months after stopping it. There is a potential for serious side effects to an unborn child. Talk to your health care professional or pharmacist for more information. Do not breast-feed an infant while taking this medicine. What side effects may I notice from receiving this medicine? Side effects that you should report to your doctor or health care professional as soon as possible: -allergic reactions like skin rash, itching or hives, swelling of the face, lips, or tongue -breathing problems or shortness of breath -diarrhea -dry, nonproductive cough -low blood counts - this medicine may decrease the number of white blood cells, red blood cells and platelets. You may be at increased risk for infections and bleeding. -mouth sores -redness, blistering, peeling or loosening of the skin, including inside the mouth -signs of infection - fever or chills, cough, sore throat, pain or trouble passing urine -signs and symptoms of bleeding such as bloody or black, tarry stools; red or dark-brown urine; spitting up blood or brown material that looks like coffee grounds; red  spots on the skin; unusual bruising or bleeding from the eye, gums, or nose -signs and symptoms of kidney injury like trouble passing urine or change in the amount of urine -signs and symptoms of liver injury like dark yellow or brown urine; general ill feeling or flu-like symptoms; light-colored stools; loss of appetite; nausea; right upper belly pain; unusually weak or tired; yellowing of the eyes or skin Side effects that usually do not require medical attention (report to your doctor or health care professional if they continue or are bothersome): -dizziness -hair loss -tiredness -upset  stomach -vomiting This list may not describe all possible side effects. Call your doctor for medical advice about side effects. You may report side effects to FDA at 1-800-FDA-1088. Where should I keep my medicine? Keep out of the reach of children. Store at room temperature between 20 and 25 degrees C (68 and 77 degrees F). Protect from light. Throw away any unused medicine after the expiration date. NOTE: This sheet is a summary. It may not cover all possible information. If you have questions about this medicine, talk to your doctor, pharmacist, or health care provider.  2018 Elsevier/Gold Standard (2015-02-14 05:39:22)   Hydroxychloroquine tablets What is this medicine? HYDROXYCHLOROQUINE (hye drox ee KLOR oh kwin) is used to treat rheumatoid arthritis and systemic lupus erythematosus. It is also used to treat malaria. This medicine may be used for other purposes; ask your health care provider or pharmacist if you have questions. COMMON BRAND NAME(S): Plaquenil, Quineprox What should I tell my health care provider before I take this medicine? They need to know if you have any of these conditions: -diabetes -eye disease, vision problems -G6PD deficiency -history of blood diseases -history of irregular heartbeat -if you often drink alcohol -kidney disease -liver disease -porphyria -psoriasis -seizures -an unusual or allergic reaction to chloroquine, hydroxychloroquine, other medicines, foods, dyes, or preservatives -pregnant or trying to get pregnant -breast-feeding How should I use this medicine? Take this medicine by mouth with a glass of water. Follow the directions on the prescription label. Avoid taking antacids within 4 hours of taking this medicine. It is best to separate these medicines by at least 4 hours. Do not cut, crush or chew this medicine. You can take it with or without food. If it upsets your stomach, take it with food. Take your medicine at regular intervals. Do  not take your medicine more often than directed. Take all of your medicine as directed even if you think you are better. Do not skip doses or stop your medicine early. Talk to your pediatrician regarding the use of this medicine in children. While this drug may be prescribed for selected conditions, precautions do apply. Overdosage: If you think you have taken too much of this medicine contact a poison control center or emergency room at once. NOTE: This medicine is only for you. Do not share this medicine with others. What if I miss a dose? If you miss a dose, take it as soon as you can. If it is almost time for your next dose, take only that dose. Do not take double or extra doses. What may interact with this medicine? Do not take this medicine with any of the following medications: -cisapride -dofetilide -dronedarone -live virus vaccines -penicillamine -pimozide -thioridazine -ziprasidone This medicine may also interact with the following medications: -ampicillin -antacids -cimetidine -cyclosporine -digoxin -medicines for diabetes, like insulin, glipizide, glyburide -medicines for seizures like carbamazepine, phenobarbital, phenytoin -mefloquine -methotrexate -other medicines that prolong the QT  interval (cause an abnormal heart rhythm) -praziquantel This list may not describe all possible interactions. Give your health care provider a list of all the medicines, herbs, non-prescription drugs, or dietary supplements you use. Also tell them if you smoke, drink alcohol, or use illegal drugs. Some items may interact with your medicine. What should I watch for while using this medicine? Tell your doctor or healthcare professional if your symptoms do not start to get better or if they get worse. Avoid taking antacids within 4 hours of taking this medicine. It is best to separate these medicines by at least 4 hours. Tell your doctor or health care professional right away if you have any  change in your eyesight. Your vision and blood may be tested before and during use of this medicine. This medicine can make you more sensitive to the sun. Keep out of the sun. If you cannot avoid being in the sun, wear protective clothing and use sunscreen. Do not use sun lamps or tanning beds/booths. What side effects may I notice from receiving this medicine? Side effects that you should report to your doctor or health care professional as soon as possible: -allergic reactions like skin rash, itching or hives, swelling of the face, lips, or tongue -changes in vision -decreased hearing or ringing of the ears -redness, blistering, peeling or loosening of the skin, including inside the mouth -seizures -sensitivity to light -signs and symptoms of a dangerous change in heartbeat or heart rhythm like chest pain; dizziness; fast or irregular heartbeat; palpitations; feeling faint or lightheaded, falls; breathing problems -signs and symptoms of liver injury like dark yellow or brown urine; general ill feeling or flu-like symptoms; light-colored stools; loss of appetite; nausea; right upper belly pain; unusually weak or tired; yellowing of the eyes or skin -signs and symptoms of low blood sugar such as feeling anxious; confusion; dizziness; increased hunger; unusually weak or tired; sweating; shakiness; cold; irritable; headache; blurred vision; fast heartbeat; loss of consciousness -uncontrollable head, mouth, neck, arm, or leg movements Side effects that usually do not require medical attention (report to your doctor or health care professional if they continue or are bothersome): -anxious -diarrhea -dizziness -hair loss -headache -irritable -loss of appetite -nausea, vomiting -stomach pain This list may not describe all possible side effects. Call your doctor for medical advice about side effects. You may report side effects to FDA at 1-800-FDA-1088. Where should I keep my medicine? Keep out  of the reach of children. In children, this medicine can cause overdose with small doses. Store at room temperature between 15 and 30 degrees C (59 and 86 degrees F). Protect from moisture and light. Throw away any unused medicine after the expiration date. NOTE: This sheet is a summary. It may not cover all possible information. If you have questions about this medicine, talk to your doctor, pharmacist, or health care provider.  2018 Elsevier/Gold Standard (2016-01-25 14:16:15)

## 2016-12-04 NOTE — Progress Notes (Signed)
WNL

## 2016-12-05 ENCOUNTER — Telehealth: Payer: Self-pay | Admitting: Radiology

## 2016-12-05 DIAGNOSIS — R7989 Other specified abnormal findings of blood chemistry: Secondary | ICD-10-CM

## 2016-12-05 LAB — HEPATITIS PANEL, ACUTE
HCV AB: NEGATIVE
Hep A IgM: NONREACTIVE
Hep B C IgM: NONREACTIVE
Hepatitis B Surface Ag: NEGATIVE

## 2016-12-05 LAB — COMPLETE METABOLIC PANEL WITH GFR
ALT: 8 U/L (ref 6–29)
AST: 16 U/L (ref 10–30)
Albumin: 3.8 g/dL (ref 3.6–5.1)
Alkaline Phosphatase: 90 U/L (ref 33–115)
BUN: 11 mg/dL (ref 7–25)
CHLORIDE: 105 mmol/L (ref 98–110)
CO2: 21 mmol/L (ref 20–31)
Calcium: 8.9 mg/dL (ref 8.6–10.2)
Creat: 0.74 mg/dL (ref 0.50–1.10)
GFR, Est African American: >89 mL/min (ref >=60)
GFR, Est Non African American: >89 mL/min (ref >=60)
GLUCOSE: 89 mg/dL (ref 65–99)
POTASSIUM: 4 mmol/L (ref 3.5–5.3)
SODIUM: 137 mmol/L (ref 135–146)
Total Bilirubin: 0.2 mg/dL (ref 0.2–1.2)
Total Protein: 7.3 g/dL (ref 6.1–8.1)

## 2016-12-05 LAB — GLUCOSE 6 PHOSPHATE DEHYDROGENASE: G-6PDH: 12.3 U/g Hgb (ref 7.0–20.5)

## 2016-12-05 LAB — URINALYSIS, MICROSCOPIC ONLY
Casts: NONE SEEN [LPF]
YEAST: NONE SEEN [HPF]

## 2016-12-05 LAB — URINALYSIS, ROUTINE W REFLEX MICROSCOPIC
BILIRUBIN URINE: NEGATIVE
GLUCOSE, UA: NEGATIVE
Hgb urine dipstick: NEGATIVE
Ketones, ur: NEGATIVE
Nitrite: NEGATIVE
Protein, ur: NEGATIVE
SPECIFIC GRAVITY, URINE: 1.026 (ref 1.001–1.035)
pH: 5.5 (ref 5.0–8.0)

## 2016-12-05 LAB — ANTI-NUCLEAR AB-TITER (ANA TITER)

## 2016-12-05 LAB — SEDIMENTATION RATE: SED RATE: 34 mm/h — AB (ref 0–20)

## 2016-12-05 LAB — CARDIOLIPIN ANTIBODIES, IGG, IGM, IGA: Anticardiolipin IgA: <11 [APL'U]

## 2016-12-05 LAB — ANTI-DNA ANTIBODY, DOUBLE-STRANDED: ds DNA Ab: 10 IU/mL — ABNORMAL HIGH

## 2016-12-05 LAB — ANA: Anti Nuclear Antibody(ANA): POSITIVE — AB

## 2016-12-05 LAB — VITAMIN D 25 HYDROXY (VIT D DEFICIENCY, FRACTURES): Vit D, 25-Hydroxy: 11 ng/mL — ABNORMAL LOW (ref 30–100)

## 2016-12-05 LAB — HIV ANTIBODY (ROUTINE TESTING W REFLEX): HIV: NONREACTIVE

## 2016-12-05 MED ORDER — VITAMIN D3 1.25 MG (50000 UT) PO CAPS: 50000.0000 [IU] | ORAL_CAPSULE | ORAL | 0 refills | Status: AC

## 2016-12-05 NOTE — Telephone Encounter (Signed)
-----   Message from Pollyann SavoyShaili Deveshwar, MD sent at 12/05/2016  8:29 AM EDT ----- Vitamin D 50,000 units twice a week total 90 days supply check vitamin D level and 3 months. She's also check anti-tTG and antigliadin antibody with the next labs

## 2016-12-05 NOTE — Telephone Encounter (Signed)
I have called patient to advise lab shows low Vitamin D Rx sent in for her   Left message for her to call back Lab orders pended.

## 2016-12-05 NOTE — Progress Notes (Signed)
Vitamin D 50,000 units twice a week total 90 days supply check vitamin D level and 3 months. She's also check anti-tTG and antigliadin antibody with the next labs

## 2016-12-06 LAB — IGG, IGA, IGM
IGA: 603 mg/dL — AB (ref 81–463)
IGM, SERUM: 84 mg/dL (ref 48–271)
IgG (Immunoglobin G), Serum: 1731 mg/dL — ABNORMAL HIGH (ref 694–1618)

## 2016-12-06 LAB — PROTEIN ELECTROPHORESIS, SERUM, WITH REFLEX
ALBUMIN ELP: 3.6 g/dL — AB (ref 3.8–4.8)
ALPHA-1-GLOBULIN: 0.3 g/dL (ref 0.2–0.3)
ALPHA-2-GLOBULIN: 0.7 g/dL (ref 0.5–0.9)
Beta 2: 0.5 g/dL (ref 0.2–0.5)
Beta Globulin: 0.6 g/dL (ref 0.4–0.6)
GAMMA GLOBULIN: 1.6 g/dL (ref 0.8–1.7)
Total Protein, Serum Electrophoresis: 7.3 g/dL (ref 6.1–8.1)

## 2016-12-06 LAB — QUANTIFERON TB GOLD ASSAY (BLOOD)
Interferon Gamma Release Assay: NEGATIVE
MITOGEN-NIL SO: 4.24 [IU]/mL
QUANTIFERON NIL VALUE: 0.07 [IU]/mL
Quantiferon Tb Ag Minus Nil Value: <0 IU/mL

## 2016-12-06 LAB — C3 AND C4
C3 COMPLEMENT: 120 mg/dL (ref 83–193)
C4 COMPLEMENT: 21 mg/dL (ref 15–57)

## 2016-12-06 LAB — RFX DRVVT SCR W/RFLX CONF 1:1 MIX: dRVVT Screen: 38 s (ref <=45)

## 2016-12-06 LAB — LUPUS ANTICOAGULANT PANEL

## 2016-12-06 LAB — BETA-2 GLYCOPROTEIN ANTIBODIES
Beta-2 Glyco I IgG: <9 SGU (ref <=20)
Beta-2-Glycoprotein I IgM: <9 SMU (ref <=20)

## 2016-12-06 LAB — RFX PTT-LA W/RFX TO HEX PHASE CONF: PTT-LA Screen: 38 s (ref <=40)

## 2016-12-07 NOTE — Progress Notes (Signed)
Ok to start on MTX as planned

## 2016-12-11 ENCOUNTER — Telehealth: Payer: Self-pay | Admitting: Pharmacist

## 2016-12-11 DIAGNOSIS — E559 Vitamin D deficiency, unspecified: Secondary | ICD-10-CM

## 2016-12-11 DIAGNOSIS — Z9189 Other specified personal risk factors, not elsewhere classified: Secondary | ICD-10-CM

## 2016-12-12 NOTE — Telephone Encounter (Signed)
Plan per 12/04/16 visit, "If your labs are normal, we plan to start you on methotrexate 0.6 mL subcutaneous weekly for two weeks.  Then get standing labs.  If labs are stable, we will increase to methotrexate 0.8 mL subcutaneous weekly.  Get labs again two weeks after increasing methotrexate dose.  We want you to take folic acid 2 mg by mouth daily with methotrexate."    Patient's labs were reviewed by Dr. Corliss Skainseveshwar who gave ok to start methotrexate as planned.    I called patient to discus.  I left a message asking her to call me back.   Lilla Shookachel Maci Eickholt, Pharm.D., BCPS, CPP Clinical Pharmacist Pager: [phone] Phone: [phone] 12/12/2016 8:22 AM

## 2016-12-13 NOTE — Telephone Encounter (Signed)
Advised patient she needs urine culture.  She voiced understanding.  She plans to go [place] [facility] [place] in [place].  Urine culture orders were placed.

## 2016-12-13 NOTE — Telephone Encounter (Addendum)
I called patient to review labs and discuss starting methotrexate.  Patient reports she has history of recurrent UTIs and she feels like she is having a UTI at this time.  Patient had UA on 12/04/16 which showed 1+ LE, urine microscopic showed few bacteria.  Urine culture was not done.  I advised we will hold off on methotrexate at this time.  Patient does not have established primary care in the area yet.  She reports she is normally prescribed cipro for UTIs as she has sulfa allergy.  Please advise.

## 2016-12-13 NOTE — Telephone Encounter (Signed)
Patient should come in to get a urine culture and then we can prescribed her Cipro.

## 2016-12-17 ENCOUNTER — Telehealth: Payer: Self-pay | Admitting: Rheumatology

## 2016-12-17 LAB — URINE CULTURE

## 2016-12-17 NOTE — Telephone Encounter (Signed)
Patient would like to know urine analysis results. Please call.

## 2016-12-17 NOTE — Telephone Encounter (Signed)
Cipro 500 mg po BID x10d. Repeat UAafter abx

## 2016-12-18 MED ORDER — CIPROFLOXACIN HCL 500 MG PO TABS: 500.0000 mg | ORAL_TABLET | Freq: Two times a day (BID) | ORAL | 0 refills | Status: DC

## 2016-12-18 NOTE — Telephone Encounter (Signed)
Called patient left message for her to call me back, she has UTI and will need to take ABX< Dr Corliss Skainseveshwar has had me send this in for her.

## 2016-12-20 NOTE — Telephone Encounter (Signed)
Patient advised of lab results. Patient has picked up her medication from the pharmacy and has started it.

## 2016-12-27 DIAGNOSIS — N390 Urinary tract infection, site not specified: Secondary | ICD-10-CM | POA: Insufficient documentation

## 2016-12-27 NOTE — Progress Notes (Signed)
Office Visit Note  Patient: Shawna Stevens             Date of Birth: 1970-11-27           MRN: [id number]             PCP: Patient, No Pcp Per Referring: No ref. provider found Visit Date: 01/02/2017 Occupation: @GUAROCC @    Subjective:  Right wrist pain.   History of Present Illness: Shawna Stevens is a 46 y.o. female with history of rheumatoid arthritis and lupus overlap. She states that her right wrist joint continues to hurt. None of the other joints are painful. She had recent urinary tract infection which was treated with Cipro. She states she has history of recurrent UTIs in the past. She was seen by her urologist and [place] who did cystoscopy and the workup was negative.   Activities of Daily Living:  Patient reports morning stiffness for 0 minute.   Patient Denies nocturnal pain.  Difficulty dressing/grooming: Denies Difficulty climbing stairs: Denies Difficulty getting out of chair: Denies Difficulty using hands for taps, buttons, cutlery, and/or writing: Denies   Review of Systems  Constitutional: Positive for fatigue. Negative for night sweats, weight gain, weight loss and weakness.  HENT: Negative for mouth sores, trouble swallowing, trouble swallowing, mouth dryness and nose dryness.   Eyes: Negative for pain, redness, visual disturbance and dryness.  Respiratory: Negative for cough, shortness of breath and difficulty breathing.   Cardiovascular: Negative for chest pain, palpitations, hypertension, irregular heartbeat and swelling in legs/feet.  Gastrointestinal: Negative for blood in stool, constipation and diarrhea.  Endocrine: Negative for increased urination.  Genitourinary: Negative for vaginal dryness.  Musculoskeletal: Positive for arthralgias and joint pain. Negative for joint swelling, myalgias, muscle weakness, morning stiffness, muscle tenderness and myalgias.  Skin: Positive for sensitivity to sunlight. Negative for color change, rash, hair loss, skin  tightness and ulcers.  Allergic/Immunologic: Negative for susceptible to infections.  Neurological: Negative for dizziness, memory loss and night sweats.  Hematological: Negative for swollen glands.  Psychiatric/Behavioral: Negative for depressed mood and sleep disturbance. The patient is not nervous/anxious.     PMFS History:  Patient Active Problem List   Diagnosis Date Noted  . Recurrent UTI 12/27/2016  . Other fatigue 11/29/2016  . Alopecia 11/29/2016  . Photosensitivity 11/29/2016  . High risk medication use 11/29/2016  . Rheumatoid arthritis involving multiple sites with positive rheumatoid factor (HCC) 10/08/2016  . Systemic lupus erythematosus (HCC) 10/08/2016  . History of pulmonary embolism 10/08/2016    Past Medical History:  Diagnosis Date  . Lupus   . Pulmonary embolism (HCC)   . Rheumatoid aortitis     No family history on file. Past Surgical History:  Procedure Laterality Date  . TONSILLECTOMY     Social History   Social History Narrative  . No narrative on file     Objective: Vital Signs: BP 124/78   Pulse 82   Resp 14   Ht 5\' 8"  (1.727 m)   Wt 188 lb (85.3 kg)   LMP 12/19/2016   BMI 28.59 kg/m    Physical Exam  Constitutional: She is oriented to person, place, and time. She appears well-developed and well-nourished.  HENT:  Head: Normocephalic and atraumatic.  Eyes: Conjunctivae and EOM are normal.  Neck: Normal range of motion.  Cardiovascular: Normal rate, regular rhythm, normal heart sounds and intact distal pulses.   Pulmonary/Chest: Effort normal and breath sounds normal.  Abdominal: Soft. Bowel sounds are normal.  Lymphadenopathy:  She has no cervical adenopathy.  Neurological: She is alert and oriented to person, place, and time.  Skin: Skin is warm and dry. Capillary refill takes less than 2 seconds.  Psychiatric: She has a normal mood and affect. Her behavior is normal.  Nursing note and vitals reviewed.    Musculoskeletal  Exam: C-spine and thoracic lumbar spine good range of motion. Shoulder joints elbow joints are good range of motion. She is very limited range of motion of her right wrist joint was synovitis and synovial thickening she has some thickening over bilateral MCP joints. No synovitis over her MCP joints noted. Hip joints knee joints ankles MTPs PIPs with good range of motion.  CDAI Exam: CDAI Homunculus Exam:   Tenderness:  RUE: wrist  Swelling:  RUE: wrist  Joint Counts:  CDAI Tender Joint count: 1 CDAI Swollen Joint count: 1  Global Assessments:  Patient Global Assessment: 4 Provider Global Assessment: 4  CDAI Calculated Score: 10    Investigation: Findings:  12/04/2016 CMP normal, CBC normal, Anti-DNA antibody, double-stranded 10, ANA positive 1:160 titer; IgG high 1731, IgA high 603 , IgM 84; Sedimentation rate 34, Serum protein electrophoresis with reflex shows isolated decrease in albumin this pattern suggestive of decreased protein synthesis or protein loss consider ordering prealbumin quantitation. Urinalysis/ Urine Microscopic indicate UTI, patient treated with Cipro, Urine Culture ordered; Vitamin D low 11 ; Beta-2 glycoprotein antibodies normal; C3 and C4 normal;  Cardiolipin antibodies  Normal; Glucose 6 phosphate dehydrogenase normal; Hepatitis panel, acute negative; HIV antibody non reactive,  Lupus anticoagulant panel , Lupus Anticoagulant not detected, and Quantiferon tb gold assay negative.      Imaging: Dg Chest 2 View  Result Date: 12/04/2016 CLINICAL DATA:  Immunosuppression.  Rule out tuberculosis. EXAM: CHEST  2 VIEW COMPARISON:  None. FINDINGS: The heart size and mediastinal contours are within normal limits. Both lungs are clear. The visualized skeletal structures are unremarkable. IMPRESSION: No active cardiopulmonary disease. No radiographic evidence of tuberculosis. Electronically Signed   By: Tollie Eth M.D.   On: 12/04/2016 12:55   Xr Foot 2 Views  Left  Result Date: 12/04/2016 Minimal PIP/DIP joint space narrowing was noted. No MTP joint space narrowing was noted. No erosive changes were noted. The metatarsal tarsal or intertarsal joint space narrowing was noted. No erosive changes were noted. Impression: Normal x-ray of the foot  Xr Foot 2 Views Right  Result Date: 12/04/2016 Right first MTP, all PIP, all DIP narrowing was noted. No other MTP joint space narrowing was noted. No erosive changes were noted. No metatarsal tarsal, intertarsal joint space narrowing was noted. Impression mild osteoarthritic changes were noted in the foot.  Xr Hand 2 View Left  Result Date: 12/04/2016 Juxta articular osteopenia noted, left second MCP narrowing, minimal PIP DIP narrowing, mild intercarpal radiocarpal joint space narrowing, no erosive changes were noted. These findings were consistent with rheumatoid arthritis.  Xr Hand 2 View Right  Result Date: 12/04/2016 Juxta articular osteopenia noted. Right second MCP and first MCP narrowing was noted. Intercarpal, metacarpocarpal and radiocarpal joint space narrowing was noted. No erosive changes were noted. Impression: These findings were consistent with inflammatory arthritis.   Speciality Comments: No specialty comments available.    Procedures:  No procedures performed Allergies: Sulfa antibiotics   Assessment / Plan:     Visit Diagnoses: Rheumatoid arthritis involving multiple sites with positive rheumatoid factor (HCC) - Positive RF, positive anti-CCP, positive synovitis. She continues to have right wrist joint pain and swelling with  slight limited range of motion. She's having inadequate response to Plaquenil. The plan is to start her on subcutaneous methotrexate. She had urinary tract infection. I would like to repeat her urine culture today fits negative will start her on methotrexate. Indications side effects contraindications were discussed at length during the last visit.  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 Her symptoms are stable currently  High risk medication use - Plaquenil 200 mg by mouth twice a day, prednisone taper, discussed subcutaneous methotrexate last visit. Plan is start on methotrexate after urine culture results.  Other fatigue: She continues to have some fatigue.  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 She has recurrent urinary tract infections. I would like for her to establish with a urologist here. I will refer her to urology.  She also needs to establish with internal medicine and we will make a referral for that as well. She prefers PCP in [place] area  Vitamin D deficiency - Vitamin D 11 12/04/2016 . She is on vitamin D supplement.   Orders: Orders Placed This Encounter  Procedures  . Urine Culture  . Ambulatory referral to Internal Medicine  . Ambulatory referral to Urology   Meds ordered this encounter  Medications  . hydroxychloroquine (PLAQUENIL) 200 MG tablet    Sig: Take 1 tablet (200 mg total) by mouth 2 (two) times daily.    Dispense:  180 tablet    Refill:  1    Face-to-face time spent with patient was 30 minutes. 50% of time was spent in counseling and coordination of care.  Follow-Up Instructions: Return in about 3 months (around 04/04/2017) for Rheumatoid arthritis.   Pollyann SavoyShaili Saraann Enneking, MD  Note - This record has been created using [employer] software.  Chart creation errors have been sought, but may not always  have been located. Such creation errors do not reflect on  the standard of medical care.

## 2017-01-02 ENCOUNTER — Ambulatory Visit (INDEPENDENT_AMBULATORY_CARE_PROVIDER_SITE_OTHER): Admitting: Rheumatology

## 2017-01-02 ENCOUNTER — Encounter: Payer: Self-pay | Admitting: Rheumatology

## 2017-01-02 VITALS — BP 124/78 | HR 82 | Resp 14 | Ht 68.0 in | Wt 188.0 lb

## 2017-01-02 DIAGNOSIS — Z0000 Encounter for general adult medical examination without abnormal findings: Secondary | ICD-10-CM

## 2017-01-02 DIAGNOSIS — R5383 Other fatigue: Secondary | ICD-10-CM | POA: Diagnosis not present

## 2017-01-02 DIAGNOSIS — Z79899 Other long term (current) drug therapy: Secondary | ICD-10-CM

## 2017-01-02 DIAGNOSIS — E559 Vitamin D deficiency, unspecified: Secondary | ICD-10-CM

## 2017-01-02 DIAGNOSIS — M0579 Rheumatoid arthritis with rheumatoid factor of multiple sites without organ or systems involvement: Secondary | ICD-10-CM | POA: Diagnosis not present

## 2017-01-02 DIAGNOSIS — M3219 Other organ or system involvement in systemic lupus erythematosus: Secondary | ICD-10-CM | POA: Diagnosis not present

## 2017-01-02 DIAGNOSIS — Z86711 Personal history of pulmonary embolism: Secondary | ICD-10-CM

## 2017-01-02 DIAGNOSIS — N390 Urinary tract infection, site not specified: Secondary | ICD-10-CM

## 2017-01-02 MED ORDER — HYDROXYCHLOROQUINE SULFATE 200 MG PO TABS: 200.0000 mg | ORAL_TABLET | Freq: Two times a day (BID) | ORAL | 1 refills | Status: DC

## 2017-01-02 NOTE — Progress Notes (Signed)
Rheumatology Medication Review by a Pharmacist Does the patient feel that his/her medications are working for him/her?  No, patient is on hydroxychloroquine and continues to have some swelling.  We planned to start her on methotrexate but were waiting until UTI resolved.   Has the patient been experiencing any side effects to the medications prescribed?  No Does the patient have any problems obtaining medications?  No  Issues to address at subsequent visits: None   Pharmacist comments:  Shawna Stevens is a pleasant 46 yo F who presents for follow up of rheumatoid arthritis.  She continues to have hydroxychloroquine 200 mg by mouth twice daily.  Most recent standing labs were normal on 12/04/16.  Discussed importance of annual eye exams while on hydroxychloroquine.  Patient reports she used to have eye exam when she was in [place].  I advised her to check with her eye doctor to see when her last exam was and to have them fax us those results.  I provided her with our fax number.  If it has been over a year, I advised her to schedule eye exam and provided her with eye exam form.    Plan is to start methotrexate once UTI resolves.  Repeat urine culture was ordered today and if negative, will start patient on methotrexate.  Patient will need labs every 2 weeks x 2 after starting methotrexate then every 2 months.  Patient has been counseled on methotrexate.  Reviewed purpose, proper use, and adverse effects of methotrexate.  Patient denies any questions or concerns at this time.    Lilla Shookachel Sonnie Bias, Pharm.D., BCPS, CPP Clinical Pharmacist Pager: [phone] Phone: [phone] 01/02/2017 11:59 AM

## 2017-01-02 NOTE — Patient Instructions (Signed)
Standing Labs We placed an order today for your standing lab work.    Please come back and get your standing labs in 2 weeks after starting methotrexate, then 2 weeks after increasing the dose, then every 2 months.  We have open lab Monday through Friday from 8:30-11:30 AM and 1:30-4 PM at the office of Dr. Pollyann SavoyShaili Deveshwar.   The office is located at [address], [address], [place], [place] [address] No appointment is necessary.   Labs are drawn by [facility].  You may receive a bill from [facility] for your lab work. If you have any questions regarding directions or hours of operation,  please call [phone].    Methotrexate subcutaneous injection What is this medicine? METHOTREXATE (METH oh TREX ate) is a cytotoxic drug that also suppresses the immune system. It is used to treat psoriasis and rheumatoid arthritis. This medicine may be used for other purposes; ask your health care provider or pharmacist if you have questions. COMMON BRAND NAME(S): Otrexup, Rasuvo What should I tell my health care provider before I take this medicine? They need to know if you have any of these conditions: -fluid in the stomach area or lungs -if you often drink alcohol -infection or immune system problems -kidney disease -liver disease -low blood counts, like low white cell, platelet, or red cell counts -lung disease -radiation therapy -stomach ulcers -ulcerative colitis -an unusual or allergic reaction to methotrexate, other medicines, foods, dyes, or preservatives -pregnant or trying to get pregnant -breast-feeding How should I use this medicine? This medicine is for injection under the skin. You will be taught how to prepare and give this medicine. Refer to the Instructions for Use that come with your medication packaging. Use exactly as directed. Take your medicine at regular intervals. Do not take your medicine more often than directed. This medicine should be taken weekly, NOT daily. It is  important that you put your used needles and syringes in a special sharps container. Do not put them in a trash can. If you do not have a sharps container, call your pharmacist of healthcare provider to get one. Talk to your pediatrician regarding the use of this medicine in children. While this drug may be prescribed for children as Concepcion as 2 years for selected conditions, precautions do apply. Overdosage: If you think you have taken too much of this medicine contact a poison control center or emergency room at once. NOTE: This medicine is only for you. Do not share this medicine with others. What if I miss a dose? If you are not sure if this medicine was injected or if you have a hard time giving the injection, do not inject another dose. Talk with your doctor or health care professional. What may interact with this medicine? This medicine may interact with the following medications: -acitretin -aspirin or aspirin-like medicines including salicylates -azathioprine -certain antibiotics like chloramphenicol, penicillin, tetracycline -cyclosporine -gold -hydroxychloroquine -live virus vaccines -mercaptopurine -NSAIDs, medicines for pain and inflammation, like ibuprofen or naproxen -other cytotoxic agents -penicillamine -phenylbutazone -phenytoin -probenacid -retinoids such as isotretinoin and tretinoin -steroid medicines like prednisone or cortisone -sulfonamides like sulfasalazine and trimethoprim/sulfamethoxazole -theophylline This list may not describe all possible interactions. Give your health care provider a list of all the medicines, herbs, non-prescription drugs, or dietary supplements you use. Also tell them if you smoke, drink alcohol, or use illegal drugs. Some items may interact with your medicine. What should I watch for while using this medicine? Avoid alcoholic drinks. This medicine can  make you more sensitive to the sun. Keep out of the sun. If you cannot avoid being in  the sun, wear protective clothing and use sunscreen. Do not use sun lamps or tanning beds/booths. You may get drowsy or dizzy. Do not drive, use machinery, or do anything that needs mental alertness until you know how this medicine affects you. Do not stand or sit up quickly, especially if you are an older patient. This reduces the risk of dizzy or fainting spells. You may need blood work done while you are taking this medicine. Call your doctor or health care professional for advice if you get a fever, chills or sore throat, or other symptoms of a cold or flu. Do not treat yourself. This drug decreases your body's ability to fight infections. Try to avoid being around people who are sick. This medicine may increase your risk to bruise or bleed. Call your doctor or health care professional if you notice any unusual bleeding. Check with your doctor or health care professional if you get an attack of severe diarrhea, nausea and vomiting, or if you sweat a lot. The loss of too much body fluid can make it dangerous for you to take this medicine. Talk to your doctor about your risk of cancer. You may be more at risk for certain types of cancers if you take this medicine. Both men and women must use effective birth control with this medicine. Do not become pregnant while taking this medicine or until at least 1 normal menstrual cycle has occurred after stopping it. Women should inform their doctor if they wish to become pregnant or think they might be pregnant. Men should not father a child while taking this medicine and for 3 months after stopping it. There is a potential for serious side effects to an unborn child. Talk to your health care professional or pharmacist for more information. Do not breast-feed an infant while taking this medicine. What side effects may I notice from receiving this medicine? Side effects that you should report to your doctor or health care professional as soon as  possible: -allergic reactions like skin rash, itching or hives, swelling of the face, lips, or tongue -breathing problems or shortness of breath -diarrhea -dry, nonproductive cough -low blood counts - this medicine may decrease the number of white blood cells, red blood cells and platelets. You may be at increased risk of infections and bleeding -mouth sores -redness, blistering, peeling or loosening of the skin, including inside the mouth -signs of infection - fever or chills, cough, sore throat, pain or difficulty passing urine -signs and symptoms of bleeding such as bloody or black, tarry stools; red or dark-brown urine; spitting up blood or brown material that looks like coffee grounds; red spots on the skin; unusual bruising or bleeding from the eye, gums, or nose -signs and symptoms of kidney injury like trouble passing urine or change in the amount of urine -signs and symptoms of liver injury like dark yellow or brown urine; general ill feeling or flu-like symptoms; light-colored stools; loss of appetite; nausea; right upper belly pain; unusually weak or tired; yellowing of the eyes or skin Side effects that usually do not require medical attention (report to your doctor or health care professional if they continue or are bothersome): -dizziness -hair loss -headache -stomach pain -upset stomach -vomiting This list may not describe all possible side effects. Call your doctor for medical advice about side effects. You may report side effects to FDA at 1-800-FDA-1088.  Where should I keep my medicine? Keep out of the reach of children. You will be instructed on how to store this medicine. Throw away any unused medicine after the expiration date on the label. NOTE: This sheet is a summary. It may not cover all possible information. If you have questions about this medicine, talk to your doctor, pharmacist, or health care provider.  2018 Elsevier/Gold Standard (2015-07-14 11:50:46)

## 2017-01-04 LAB — URINE CULTURE

## 2017-01-04 NOTE — Progress Notes (Signed)
Okay to start on methotrexate as discussed during the visit.

## 2017-01-07 ENCOUNTER — Telehealth: Payer: Self-pay | Admitting: Pharmacist

## 2017-01-07 DIAGNOSIS — Z79899 Other long term (current) drug therapy: Secondary | ICD-10-CM

## 2017-01-07 NOTE — Telephone Encounter (Signed)
Attempted to call patient to review urine culture results and plan to start methotrexate 0.6 mL subcutaneous weekly for two weeks then methotrexate 0.8 mL subcutaneous weekly if labs are stable and folic acid 2 mg by mouth daily.  Patient will need labs every 2 weeks x 2, then every 2 months.    I left patient a message asking her to call me back.    Lilla Shookachel Haani Bakula, Pharm.D., BCPS, CPP Clinical Pharmacist Pager: [phone] Phone: [phone] 01/07/2017 2:08 PM

## 2017-01-07 NOTE — Telephone Encounter (Signed)
-----   Message from Henriette CombsAndrea L Hatton, LPN sent at 1/61/09607/16/2018  1:59 PM EDT ----- Please discuss results. Thanks!

## 2017-01-08 MED ORDER — METHOTREXATE SODIUM CHEMO INJECTION 50 MG/2ML: INTRAMUSCULAR | 0 refills | Status: DC

## 2017-01-08 MED ORDER — "TUBERCULIN-ALLERGY SYRINGES 27G X 1/2"" 1 ML KIT": 1.0000 | PACK | 3 refills | Status: DC

## 2017-01-08 MED ORDER — FOLIC ACID 1 MG PO TABS: 2.0000 mg | ORAL_TABLET | Freq: Every day | ORAL | 3 refills | Status: DC

## 2017-01-08 NOTE — Telephone Encounter (Signed)
I spoke to patient.  Reviewed labs and plan with patient.  She voiced understanding and denies any questions or concerns at this time.   Lilla Shookachel Henderson, Pharm.D., BCPS, CPP Clinical Pharmacist Pager: [phone] Phone: [phone] 01/08/2017 10:26 AM

## 2017-01-24 ENCOUNTER — Other Ambulatory Visit: Payer: Self-pay

## 2017-01-24 DIAGNOSIS — Z79899 Other long term (current) drug therapy: Secondary | ICD-10-CM

## 2017-01-24 LAB — CBC WITH DIFFERENTIAL/PLATELET
BASOS PCT: 1 %
Basophils Absolute: 41 cells/uL (ref 0–200)
EOS PCT: 2 %
Eosinophils Absolute: 82 cells/uL (ref 15–500)
HCT: 38.4 % (ref 35.0–45.0)
Hemoglobin: 12.6 g/dL (ref 11.7–15.5)
LYMPHS PCT: 36 %
Lymphs Abs: 1476 cells/uL (ref 850–3900)
MCH: 29.4 pg (ref 27.0–33.0)
MCHC: 32.8 g/dL (ref 32.0–36.0)
MCV: 89.7 fL (ref 80.0–100.0)
MONOS PCT: 12 %
MPV: 13.2 fL — AB (ref 7.5–12.5)
Monocytes Absolute: 492 cells/uL (ref 200–950)
NEUTROS ABS: 2009 {cells}/uL (ref 1500–7800)
Neutrophils Relative %: 49 %
PLATELETS: 180 10*3/uL (ref 140–400)
RBC: 4.28 MIL/uL (ref 3.80–5.10)
RDW: 15.3 % — AB (ref 11.0–15.0)
WBC: 4.1 10*3/uL (ref 3.8–10.8)

## 2017-01-25 LAB — COMPLETE METABOLIC PANEL WITH GFR
ALT: 17 U/L (ref 6–29)
AST: 23 U/L (ref 10–30)
Albumin: 3.7 g/dL (ref 3.6–5.1)
Alkaline Phosphatase: 92 U/L (ref 33–115)
BILIRUBIN TOTAL: 0.3 mg/dL (ref 0.2–1.2)
BUN: 11 mg/dL (ref 7–25)
CHLORIDE: 106 mmol/L (ref 98–110)
CO2: 22 mmol/L (ref 20–31)
CREATININE: 0.82 mg/dL (ref 0.50–1.10)
Calcium: 9 mg/dL (ref 8.6–10.2)
GFR, Est Non African American: 87 mL/min (ref >=60)
GLUCOSE: 91 mg/dL (ref 65–99)
Potassium: 4.5 mmol/L (ref 3.5–5.3)
SODIUM: 140 mmol/L (ref 135–146)
TOTAL PROTEIN: 6.8 g/dL (ref 6.1–8.1)

## 2017-01-25 NOTE — Progress Notes (Signed)
Within normal limits

## 2017-03-06 ENCOUNTER — Other Ambulatory Visit: Payer: Self-pay | Admitting: Rheumatology

## 2017-03-28 NOTE — Progress Notes (Deleted)
Office Visit Note  Patient: Shawna Stevens             Date of Birth: May 04, 1971           MRN: [id number]             PCP: Patient, No Pcp Per Referring: No ref. provider found Visit Date: 04/05/2017 Occupation: @    Subjective:  No chief complaint on file.   History of Present Illness: Shawna Stevens is a 46 y.o. female ***   Activities of Daily Living:  Patient reports morning stiffness for *** {minute/hour:19697}.   Patient {ACTIONS;DENIES/REPORTS:21021675::"Denies"} nocturnal pain.  Difficulty dressing/grooming: {ACTIONS;DENIES/REPORTS:21021675::"Denies"} Difficulty climbing stairs: {ACTIONS;DENIES/REPORTS:21021675::"Denies"} Difficulty getting out of chair: {ACTIONS;DENIES/REPORTS:21021675::"Denies"} Difficulty using hands for taps, buttons, cutlery, and/or writing: {ACTIONS;DENIES/REPORTS:21021675::"Denies"}   No Rheumatology ROS completed.   PMFS History:  Patient Active Problem List   Diagnosis Date Noted  . Recurrent UTI 12/27/2016  . Other fatigue 11/29/2016  . Alopecia 11/29/2016  . Photosensitivity 11/29/2016  . High risk medication use 11/29/2016  . Rheumatoid arthritis involving multiple sites with positive rheumatoid factor (HCC) 10/08/2016  . Systemic lupus erythematosus (HCC) 10/08/2016  . History of pulmonary embolism 10/08/2016    Past Medical History:  Diagnosis Date  . Lupus   . Pulmonary embolism (HCC)   . Rheumatoid aortitis     No family history on file. Past Surgical History:  Procedure Laterality Date  . TONSILLECTOMY     Social History   Social History Narrative  . No narrative on file     Objective: Vital Signs: There were no vitals taken for this visit.   Physical Exam   Musculoskeletal Exam: ***  CDAI Exam: No CDAI exam completed.    Investigation: Findings:  No eye exam on file   CBC Latest Ref Rng & Units 01/24/2017 12/04/2016 10/21/2016  WBC 3.8 - 10.8 K/uL 4.1 5.0 5.2  Hemoglobin 11.7 - 15.5 g/dL 40.9 81.1  91.4  Hematocrit 35.0 - 45.0 % 38.4 39.0 38.1  Platelets 140 - 400 K/uL 180 221 213   CMP Latest Ref Rng & Units 01/24/2017 12/04/2016 10/21/2016  Glucose 65 - 99 mg/dL 91 89 95  BUN 7 - 25 mg/dL Creatinine 0.50 - 1.10 mg/dL 7.82 9.56 2.13  Sodium 135 - 146 mmol/L 140 137 137  Potassium 3.5 - 5.3 mmol/L 4.5 4.0 3.6  Chloride 98 - 110 mmol/L 106 105 105  CO2 20 - 31 mmol/L Calcium 8.6 - 10.2 mg/dL 9.0 8.9 9.2  Total Protein 6.1 - 8.1 g/dL 6.8 7.3 -  Total Bilirubin 0.2 - 1.2 mg/dL 0.3 0.2 -  Alkaline Phos 33 - 115 U/L 92 90 -  AST 10 - 30 U/L 23 16 -  ALT 6 - 29 U/L 17 8 -   Imaging: No results found.  Speciality Comments: No specialty comments available.    Procedures:  No procedures performed Allergies: Sulfa antibiotics   Assessment / Plan:     Visit Diagnoses: Rheumatoid arthritis involving multiple sites with positive rheumatoid factor (HCC)  Other systemic lupus erythematosus with other organ involvement (HCC)  High risk medication use - Plaquenil and Injectable Methotrexate   Other fatigue  Photosensitivity  Alopecia  History of pulmonary embolism  History of recurrent UTIs    Orders: No orders of the defined types were placed in this encounter.  No orders of the defined types were placed in this encounter.   Face-to-face time spent with patient  was *** minutes. 50% of time was spent in counseling and coordination of care.  Follow-Up Instructions: No Follow-up on file.   Amy Littrell, RT  Note - This record has been created using [employer].  Chart creation errors have been sought, but may not always  have been located. Such creation errors do not reflect on  the standard of medical care.

## 2017-04-05 ENCOUNTER — Ambulatory Visit: Admitting: Rheumatology

## 2020-07-20 ENCOUNTER — Ambulatory Visit (INDEPENDENT_AMBULATORY_CARE_PROVIDER_SITE_OTHER): Admitting: Internal Medicine

## 2020-07-20 ENCOUNTER — Other Ambulatory Visit: Payer: Self-pay

## 2020-07-20 ENCOUNTER — Encounter: Payer: Self-pay | Admitting: Internal Medicine

## 2020-07-20 VITALS — BP 133/79 | HR 81 | Ht 67.5 in | Wt 195.0 lb

## 2020-07-20 DIAGNOSIS — Z86711 Personal history of pulmonary embolism: Secondary | ICD-10-CM | POA: Diagnosis not present

## 2020-07-20 DIAGNOSIS — M3219 Other organ or system involvement in systemic lupus erythematosus: Secondary | ICD-10-CM

## 2020-07-20 DIAGNOSIS — M0579 Rheumatoid arthritis with rheumatoid factor of multiple sites without organ or systems involvement: Secondary | ICD-10-CM

## 2020-07-20 DIAGNOSIS — Z79899 Other long term (current) drug therapy: Secondary | ICD-10-CM

## 2020-07-20 DIAGNOSIS — Z8639 Personal history of other endocrine, nutritional and metabolic disease: Secondary | ICD-10-CM

## 2020-07-20 LAB — CBC WITH DIFFERENTIAL/PLATELET
Absolute Monocytes: 423 cells/uL (ref 200–950)
HCT: 41.2 % (ref 35.0–45.0)
Hemoglobin: 13.5 g/dL (ref 11.7–15.5)
Lymphs Abs: 2076 cells/uL (ref 850–3900)
Total Lymphocyte: 40.7 %

## 2020-07-20 MED ORDER — METHOTREXATE 2.5 MG PO TABS: 15.0000 mg | ORAL_TABLET | ORAL | 0 refills | Status: DC

## 2020-07-20 MED ORDER — FOLIC ACID 1 MG PO TABS
1.0000 mg | ORAL_TABLET | Freq: Every day | ORAL | 3 refills | Status: DC
Start: 2020-07-20 — End: 2020-12-16

## 2020-07-20 MED ORDER — HYDROXYCHLOROQUINE SULFATE 200 MG PO TABS: 400.0000 mg | ORAL_TABLET | Freq: Every day | ORAL | 0 refills | Status: DC

## 2020-07-20 NOTE — Progress Notes (Signed)
Office Visit Note  Patient: Shawna Stevens             Date of Birth: 1971-02-17           MRN: [id number]             PCP: Patient, No Pcp Per Referring: Maggie Font, MD Visit Date: 07/20/2020   Subjective:  New Patient (Initial Visit) (Patient was previously seen by Dr. Estanislado Pandy, patient has moved several times since and was evaluated and treated in [place] most recently. Patient feels as if symptoms are well-controlled on PLQ. Patient is unsure of when her last PLQ eye exam was. )   History of Present Illness: Shawna Stevens is a 50 y.o. 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 She is a previous patient of Dr. Estanislado Pandy. She left this practice due to living for a while in [place] and in [place] but has moved back to this area and reestablishing care. She feels symptoms have been very wll controlled on methotrexate hydroxychloroquine. She has persistent left wrist limited movement but otherwise no new joint problems since before. She is not sure about last ophthalmology exam for retinal toxicity screening.   Activities of Daily Living:  Patient reports morning stiffness for 0 minutes.   Patient Denies nocturnal pain.  Difficulty dressing/grooming: Denies Difficulty climbing stairs: Denies Difficulty getting out of chair: Denies Difficulty using hands for taps, buttons, cutlery, and/or writing: Denies  Review of Systems  Constitutional: Negative for fatigue.  HENT: Positive for mouth dryness. Negative for mouth sores and nose dryness.   Eyes: Negative for pain, itching, visual disturbance and dryness.  Respiratory: Negative for cough, hemoptysis, shortness of breath and difficulty breathing.   Cardiovascular: Negative for chest pain, palpitations and swelling in legs/feet.  Gastrointestinal: Negative for abdominal pain, blood in stool, constipation and diarrhea.  Endocrine: Negative for increased urination.  Genitourinary: Negative for painful urination.   Musculoskeletal: Negative for arthralgias, joint pain, joint swelling, myalgias, muscle weakness, morning stiffness, muscle tenderness and myalgias.  Skin: Positive for rash. Negative for color change and redness.  Allergic/Immunologic: Negative for susceptible to infections.  Neurological: Negative for dizziness, numbness, headaches, memory loss and weakness.  Hematological: Negative for swollen glands.  Psychiatric/Behavioral: Negative for confusion and sleep disturbance.    PMFS History:  Patient Active Problem List   Diagnosis Date Noted  . Recurrent UTI 12/27/2016  . Other fatigue 11/29/2016  . Alopecia 11/29/2016  . Photosensitivity 11/29/2016  . High risk medication use 11/29/2016  . Rheumatoid arthritis involving multiple sites with positive rheumatoid factor ([place]) 10/08/2016  . Systemic lupus erythematosus ([place]) 10/08/2016  . History of pulmonary embolism 10/08/2016  . Pulmonary embolism ([place]) 06/03/2013    Past Medical History:  Diagnosis Date  . Lupus ([place])   . Pulmonary embolism ([place])   . Rheumatoid aortitis   . Sjogren's syndrome ([place])     Family History  Problem Relation Age of Onset  . Thyroid disease Mother    Past Surgical History:  Procedure Laterality Date  . TONSILLECTOMY     Social History   Social History Narrative  . Not on file    There is no immunization history on file for this patient.   Objective: Vital Signs: BP 133/79 (BP Location: Right Arm, Patient Position: Sitting, Cuff Size: Normal)   Pulse 81   Ht 5' 7.5" (1.715 m)   Wt 195 lb (88.5 kg)   BMI 30.09 kg/m    Physical Exam HENT:  Right Ear: External ear normal.     Left Ear: External ear normal.     Mouth/Throat:     Mouth: Mucous membranes are moist.     Pharynx: Oropharynx is clear.  Eyes:     Conjunctiva/sclera: Conjunctivae normal.  Cardiovascular:     Rate and Rhythm: Normal rate and regular rhythm.  Pulmonary:     Effort: Pulmonary effort is normal.      Breath sounds: Normal breath sounds.  Skin:    General: Skin is warm and dry.     Comments: Right forearm hyperpigmentation on extensor surface  Neurological:     General: No focal deficit present.     Mental Status: She is alert.     Deep Tendon Reflexes: Reflexes normal.  Psychiatric:        Mood and Affect: Mood normal.     Musculoskeletal Exam:  Neck full range of motion  Shoulder, elbow, full range of motion no tenderness or swelling Left wrist fused in fixed position, right wrist normal No MCP PIP or DIP synovitis, good grip strength and ROM Normal hip internal and external rotation without pain, no tenderness to lateral hip palpation Knees, ankles, MTPs full range of motion no tenderness or swelling  CDAI Exam: CDAI Score: 0.2  Patient Global: 1 mm; Provider Global: 1 mm Swollen: 0 ; Tender: 0  Joint Exam 07/20/2020   All documented joints were normal     Investigation: No additional findings.  Imaging: No results found.  Recent Labs: Lab Results  Component Value Date   WBC 5.1 07/20/2020   HGB 13.5 07/20/2020   PLT 212 07/20/2020   NA 140 07/20/2020   K 4.0 07/20/2020   CL 106 07/20/2020   CO2 27 07/20/2020   GLUCOSE 77 07/20/2020   BUN 10 07/20/2020   CREATININE 0.80 07/20/2020   BILITOT 0.3 07/20/2020   ALKPHOS 92 01/24/2017   AST 18 07/20/2020   ALT 11 07/20/2020   PROT 7.4 07/20/2020   ALBUMIN 3.7 01/24/2017   CALCIUM 9.3 07/20/2020   GFRAA 101 07/20/2020    Speciality Comments: No specialty comments available.  Procedures:  No procedures performed Allergies: Sulfa antibiotics   Assessment / Plan:     Visit Diagnoses: Rheumatoid arthritis involving multiple sites with positive rheumatoid factor (HCC) History of vitamin D deficiency - Plan: VITAMIN D 25 Hydroxy (Vit-D Deficiency, Fractures), Sedimentation rate, methotrexate (RHEUMATREX) 2.5 MG tablet, hydroxychloroquine (PLAQUENIL) 200 MG tablet  RA/SLE overlap syndrome apparently in  remission based on current history and exam findings. Continue MTX 4m PO daily and HCQ 4061mdaily. Will check ESR. Also will check vitamin D level with history of vitamin D deficiency, with increased risk of osteoporosis in long term RA would benefit with replacing.  Other systemic lupus erythematosus with other organ involvement ([facility] [place] No evidence of active lupus disease on history and exam currently. Mouth dryness and skin rashes but do not appear typical for SLE. May also be in remission on medication too.  History of pulmonary embolism  Hx of PE but without APS Abs previously and was provoked with long driving then ankle and leg swelling leading to left sided clot so not clear if related to autoimmune disease, not on long term anticoagulation.  High risk medication use - Plan: CBC with Differential/Platelet, COMPLETE METABOLIC PANEL WITH GFR, VITAMIN D 25 Hydroxy (Vit-D Deficiency, Fractures), Sedimentation rate, Ambulatory referral to Ophthalmology, folic acid (FOLVITE) 1 MG tablet  Checking CBC and CMp for MTX toxicity.  Continue folic acid 60m daily. Referral to ophthalmology for HCQ retinal exams.  Orders: Orders Placed This Encounter  Procedures  . CBC with Differential/Platelet  . COMPLETE METABOLIC PANEL WITH GFR  . VITAMIN D 25 Hydroxy (Vit-D Deficiency, Fractures)  . Sedimentation rate  . Ambulatory referral to Ophthalmology   Meds ordered this encounter  Medications  . methotrexate (RHEUMATREX) 2.5 MG tablet    Sig: Take 6 tablets (15 mg total) by mouth once a week. Caution:Chemotherapy. Protect from light.    Dispense:  72 tablet    Refill:  0  . hydroxychloroquine (PLAQUENIL) 200 MG tablet    Sig: Take 2 tablets (400 mg total) by mouth daily.    Dispense:  180 tablet    Refill:  0  . folic acid (FOLVITE) 1 MG tablet    Sig: Take 1 tablet (1 mg total) by mouth daily.    Dispense:  180 tablet    Refill:  3     Follow-Up Instructions: Return in about 3 months  (around 10/18/2020) for RA/SLE on MTX+HCQ f/u.   CCollier Salina MD  Note - This record has been created using [employer]  Chart creation errors have been sought, but may not always  have been located. Such creation errors do not reflect on  the standard of medical care.

## 2020-07-21 LAB — VITAMIN D 25 HYDROXY (VIT D DEFICIENCY, FRACTURES): Vit D, 25-Hydroxy: 13 ng/mL — ABNORMAL LOW (ref 30–100)

## 2020-07-21 LAB — SEDIMENTATION RATE: Sed Rate: 33 mm/h — ABNORMAL HIGH (ref 0–20)

## 2020-07-21 LAB — CBC WITH DIFFERENTIAL/PLATELET
Basophils Absolute: 61 cells/uL (ref 0–200)
Basophils Relative: 1.2 %
Eosinophils Absolute: 158 cells/uL (ref 15–500)
Eosinophils Relative: 3.1 %
MCH: 29.2 pg (ref 27.0–33.0)
MCHC: 32.8 g/dL (ref 32.0–36.0)
MCV: 89.2 fL (ref 80.0–100.0)
MPV: 13.9 fL — ABNORMAL HIGH (ref 7.5–12.5)
Monocytes Relative: 8.3 %
Neutro Abs: 2382 cells/uL (ref 1500–7800)
Neutrophils Relative %: 46.7 %
Platelets: 212 10*3/uL (ref 140–400)
RBC: 4.62 10*6/uL (ref 3.80–5.10)
RDW: 14.5 % (ref 11.0–15.0)
WBC: 5.1 10*3/uL (ref 3.8–10.8)

## 2020-07-21 LAB — COMPLETE METABOLIC PANEL WITH GFR
AG Ratio: 1.3 (calc) (ref 1.0–2.5)
ALT: 11 U/L (ref 6–29)
AST: 18 U/L (ref 10–35)
Albumin: 4.2 g/dL (ref 3.6–5.1)
Alkaline phosphatase (APISO): 120 U/L (ref 31–125)
BUN: 10 mg/dL (ref 7–25)
CO2: 27 mmol/L (ref 20–32)
Calcium: 9.3 mg/dL (ref 8.6–10.2)
Chloride: 106 mmol/L (ref 98–110)
Creat: 0.8 mg/dL (ref 0.50–1.10)
GFR, Est African American: 101 mL/min/{1.73_m2} (ref >=60)
GFR, Est Non African American: 87 mL/min/{1.73_m2} (ref >=60)
Globulin: 3.2 g/dL (calc) (ref 1.9–3.7)
Glucose, Bld: 77 mg/dL (ref 65–99)
Potassium: 4 mmol/L (ref 3.5–5.3)
Sodium: 140 mmol/L (ref 135–146)
Total Bilirubin: 0.3 mg/dL (ref 0.2–1.2)
Total Protein: 7.4 g/dL (ref 6.1–8.1)

## 2020-07-21 NOTE — Progress Notes (Signed)
Lab tests look fine for methotrexate and hydroxychloroquine treatment as discussed. Vitamin D level was quite low at 13 with normal being at least 30. She should increase current supplementation by 2000 units or 50 mcg daily amount. Good to address since it might improve symptoms but more so since inflammatory arthritis is a big risk for developing osteoporosis later.

## 2020-10-09 ENCOUNTER — Other Ambulatory Visit: Payer: Self-pay | Admitting: Internal Medicine

## 2020-10-09 DIAGNOSIS — M0579 Rheumatoid arthritis with rheumatoid factor of multiple sites without organ or systems involvement: Secondary | ICD-10-CM

## 2020-10-15 ENCOUNTER — Other Ambulatory Visit: Payer: Self-pay | Admitting: Internal Medicine

## 2020-10-15 DIAGNOSIS — M0579 Rheumatoid arthritis with rheumatoid factor of multiple sites without organ or systems involvement: Secondary | ICD-10-CM

## 2020-10-18 ENCOUNTER — Encounter: Payer: Self-pay | Admitting: Internal Medicine

## 2020-10-18 ENCOUNTER — Other Ambulatory Visit: Payer: Self-pay

## 2020-10-18 ENCOUNTER — Ambulatory Visit (INDEPENDENT_AMBULATORY_CARE_PROVIDER_SITE_OTHER): Admitting: Internal Medicine

## 2020-10-18 VITALS — BP 131/79 | HR 80 | Ht 67.0 in | Wt 195.6 lb

## 2020-10-18 DIAGNOSIS — M3219 Other organ or system involvement in systemic lupus erythematosus: Secondary | ICD-10-CM

## 2020-10-18 DIAGNOSIS — Z79899 Other long term (current) drug therapy: Secondary | ICD-10-CM | POA: Diagnosis not present

## 2020-10-18 DIAGNOSIS — N390 Urinary tract infection, site not specified: Secondary | ICD-10-CM

## 2020-10-18 DIAGNOSIS — M0579 Rheumatoid arthritis with rheumatoid factor of multiple sites without organ or systems involvement: Secondary | ICD-10-CM

## 2020-10-18 DIAGNOSIS — E559 Vitamin D deficiency, unspecified: Secondary | ICD-10-CM | POA: Insufficient documentation

## 2020-10-18 DIAGNOSIS — L659 Nonscarring hair loss, unspecified: Secondary | ICD-10-CM

## 2020-10-18 NOTE — Progress Notes (Signed)
Office Visit Note  Patient: Shawna Stevens             Date of Birth: [birth year]           MRN: [id number]             PCP: Patient, No Pcp Per (Inactive) Referring: No ref. provider found Visit Date: 10/18/2020   Subjective:  Follow-up (Patient feels as if symptoms are well controlled, patient is taking PLQ regularly but not taking MTX as regularly. )   History of Present Illness: Shawna Stevens is a 50 y.o. 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  Since last visit she was ill with an infection and discontinued her medications temporarily for that but afterwards only resumed the hydroxychloroquine.  Since stopping the methotrexate few weeks ago she has not noticed any particular worsening of symptoms.   Review of Systems  Constitutional: Negative for fatigue.  HENT: Negative for mouth sores, mouth dryness and nose dryness.   Eyes: Negative for pain, itching, visual disturbance and dryness.  Respiratory: Negative for cough, hemoptysis, shortness of breath and difficulty breathing.   Cardiovascular: Negative for chest pain, palpitations and swelling in legs/feet.  Gastrointestinal: Negative for abdominal pain, blood in stool, constipation and diarrhea.  Endocrine: Negative for increased urination.  Genitourinary: Negative for painful urination.  Musculoskeletal: Negative for arthralgias, joint pain, joint swelling, myalgias, muscle weakness, morning stiffness, muscle tenderness and myalgias.  Skin: Positive for rash. Negative for color change and redness.  Allergic/Immunologic: Negative for susceptible to infections.  Neurological: Negative for dizziness, numbness, headaches, memory loss and weakness.  Hematological: Negative for swollen glands.  Psychiatric/Behavioral: Negative for confusion and sleep disturbance.     PMFS History:  Patient Active Problem List   Diagnosis Date Noted  . Vitamin D deficiency 10/18/2020  . Recurrent UTI 12/27/2016  . Other fatigue 11/29/2016  . Alopecia 11/29/2016  . Photosensitivity 11/29/2016  . High risk medication use 11/29/2016  . Rheumatoid arthritis involving multiple sites with positive rheumatoid factor ([place]) 10/08/2016  . Systemic lupus erythematosus ([place]) 10/08/2016  . History of pulmonary embolism 10/08/2016  . Pulmonary embolism ([place]) 06/03/2013    Past Medical History:  Diagnosis Date  . Lupus ([place])   . Pulmonary embolism ([place])   . Rheumatoid aortitis   . Sjogren's syndrome ([place])     Family History  Problem Relation Age of Onset  . Thyroid disease Mother    Past Surgical History:  Procedure Laterality Date  . TONSILLECTOMY     Social History   Social History Narrative  . Not on file    There is no immunization history on file for this patient.   Objective: Vital Signs: BP 131/79 (BP Location: Left Arm, Patient Position: Sitting, Cuff Size: Normal)   Pulse 80   Ht 5' 7"  (1.702 m)   Wt 195 lb 9.6 oz (88.7 kg)   BMI 30.64 kg/m    Physical Exam HENT:     Right Ear: External ear normal.     Left Ear: External ear normal.  Skin:    Comments: Thin frontal hair Flat hyperpigmented patches over elbow surfaces and part of forearm  Neurological:     Mental Status: She is alert.     Musculoskeletal Exam:  Elbows full ROM no tenderness or swelling Right wrist decreased range of motion  left wrist normal Fingers full ROM no tenderness or swelling Knees full ROM no tenderness or swelling  Investigation: No additional findings.  Imaging: No results found.  Recent Labs: Lab Results  Component Value Date   WBC 6.5 10/18/2020   HGB 13.1 10/18/2020   PLT 239 10/18/2020   NA 140 10/18/2020   K 3.6 10/18/2020   CL 106 10/18/2020   CO2 23 10/18/2020   GLUCOSE 75 10/18/2020   BUN 12 10/18/2020   CREATININE 0.86 10/18/2020    BILITOT 0.4 10/18/2020   ALKPHOS 92 01/24/2017   AST 16 10/18/2020   ALT 9 10/18/2020   PROT 7.9 10/18/2020   ALBUMIN 3.7 01/24/2017   CALCIUM 9.9 10/18/2020   GFRAA 93 10/18/2020    Speciality Comments: PLQ eye exam- [facility]  No toxicity 09/08/2020, f/u 1 year  Procedures:  No procedures performed Allergies: Sulfa antibiotics   Assessment / Plan:     Visit Diagnoses: Other systemic lupus erythematosus with other organ involvement (HCC)  Rheumatoid arthritis involving multiple sites with positive rheumatoid factor ([place]) - Plan: CBC with Differential/Platelet, COMPLETE METABOLIC PANEL WITH GFR, Protein / creatinine ratio, urine, Sedimentation rate  Overlap syndrome with positive serology currently joint inflammation seems well controlled on just hydroxychloroquine.  However she is only been off methotrexate a few weeks so we will still need to see if there is any disease flare.  Checking sedimentation rate today also for monitoring.  Checking urine protein creatinine ratio for nephritis screening.  Plan to continue the hydroxychloroquine 400 mg daily we can continue just observing on this monotherapy for now unless symptoms flare.  High risk medication use - Plan: CBC with Differential/Platelet, COMPLETE METABOLIC PANEL WITH GFR  Methotrexate toxicity risks include significant cytopenia and liver function changes checking CBC and CMP today.  She had recent ophthalmology evaluation with no concerning findings for hydroxychloroquine toxicity.  Alopecia  Frontal hair thinning discussed can be due to lupus can also be related to methotrexate but to see if there is any improvement or worsening with change in treatment.  Vitamin D deficiency - Plan: VITAMIN D 25 Hydroxy (Vit-D Deficiency, Fractures)  Repeating vitamin D level today to see if adequate replacement now.  Discussed importance for vitamin D supplementation due to osteoporosis and fracture risks with history of  rheumatoid arthritis and high likelihood of deficiency with UV avoidance for lupus.  Orders: Orders Placed This Encounter  Procedures  . CBC with Differential/Platelet  . COMPLETE METABOLIC PANEL WITH GFR  . Protein / creatinine ratio, urine  . Sedimentation rate  . VITAMIN D 25 Hydroxy (Vit-D Deficiency, Fractures)   No orders of the defined types were placed in this encounter.    Follow-Up Instructions: Return in about 6 months (around 04/19/2021) for RA/SLE on HCQ f/u 6 mos.   Collier Salina, MD  Note - This record has been created using [employer].  Chart creation errors have been sought, but may not always  have been located. Such creation errors do not reflect on  the standard of medical care.

## 2020-10-19 LAB — VITAMIN D 25 HYDROXY (VIT D DEFICIENCY, FRACTURES): Vit D, 25-Hydroxy: 27 ng/mL — ABNORMAL LOW (ref 30–100)

## 2020-10-19 LAB — CBC WITH DIFFERENTIAL/PLATELET
Absolute Monocytes: 514 cells/uL (ref 200–950)
Basophils Absolute: 59 cells/uL (ref 0–200)
Basophils Relative: 0.9 %
Eosinophils Absolute: 130 cells/uL (ref 15–500)
Eosinophils Relative: 2 %
HCT: 40 % (ref 35.0–45.0)
Hemoglobin: 13.1 g/dL (ref 11.7–15.5)
Lymphs Abs: 2555 cells/uL (ref 850–3900)
MCH: 28.2 pg (ref 27.0–33.0)
MCHC: 32.8 g/dL (ref 32.0–36.0)
MCV: 86 fL (ref 80.0–100.0)
MPV: 13.6 fL — ABNORMAL HIGH (ref 7.5–12.5)
Monocytes Relative: 7.9 %
Neutro Abs: 3244 cells/uL (ref 1500–7800)
Neutrophils Relative %: 49.9 %
Platelets: 239 10*3/uL (ref 140–400)
RBC: 4.65 10*6/uL (ref 3.80–5.10)
RDW: 13.5 % (ref 11.0–15.0)
Total Lymphocyte: 39.3 %
WBC: 6.5 10*3/uL (ref 3.8–10.8)

## 2020-10-19 LAB — COMPLETE METABOLIC PANEL WITH GFR
AG Ratio: 1.3 (calc) (ref 1.0–2.5)
ALT: 9 U/L (ref 6–29)
AST: 16 U/L (ref 10–35)
Albumin: 4.4 g/dL (ref 3.6–5.1)
Alkaline phosphatase (APISO): 116 U/L (ref 31–125)
BUN: 12 mg/dL (ref 7–25)
CO2: 23 mmol/L (ref 20–32)
Calcium: 9.9 mg/dL (ref 8.6–10.2)
Chloride: 106 mmol/L (ref 98–110)
Creat: 0.86 mg/dL (ref 0.50–1.10)
GFR, Est African American: 93 mL/min/{1.73_m2} (ref >=60)
GFR, Est Non African American: 80 mL/min/{1.73_m2} (ref >=60)
Globulin: 3.5 g/dL (calc) (ref 1.9–3.7)
Glucose, Bld: 75 mg/dL (ref 65–99)
Potassium: 3.6 mmol/L (ref 3.5–5.3)
Sodium: 140 mmol/L (ref 135–146)
Total Bilirubin: 0.4 mg/dL (ref 0.2–1.2)
Total Protein: 7.9 g/dL (ref 6.1–8.1)

## 2020-10-19 LAB — PROTEIN / CREATININE RATIO, URINE
Creatinine, Urine: 348 mg/dL — ABNORMAL HIGH (ref 20–275)
Protein/Creat Ratio: 78 mg/g creat (ref 21–161)
Protein/Creatinine Ratio: 0.078 mg/mg creat (ref 0.021–0.161)
Total Protein, Urine: 27 mg/dL — ABNORMAL HIGH (ref 5–24)

## 2020-10-19 LAB — SEDIMENTATION RATE: Sed Rate: 48 mm/h — ABNORMAL HIGH (ref 0–20)

## 2020-10-26 NOTE — Progress Notes (Signed)
Lab test looks okay on the hydroxychloroquine alone at this time. Her sedimentation rate increased slightly which may indicate some inflammation increase after stopping the methotrexate but no need to change treatment for this unless symptoms change. Vitamin D level is still slightly low, she should increase her supplementation by 500 or 1000 units daily.

## 2020-12-16 ENCOUNTER — Other Ambulatory Visit (HOSPITAL_COMMUNITY)
Admission: RE | Admit: 2020-12-16 | Discharge: 2020-12-16 | Disposition: A | Discharge status: Home / Self Care | Source: Ambulatory Visit | Attending: Internal Medicine | Admitting: Internal Medicine

## 2020-12-16 ENCOUNTER — Other Ambulatory Visit: Payer: Self-pay

## 2020-12-16 ENCOUNTER — Encounter: Payer: Self-pay | Admitting: Internal Medicine

## 2020-12-16 ENCOUNTER — Ambulatory Visit (INDEPENDENT_AMBULATORY_CARE_PROVIDER_SITE_OTHER): Admitting: Internal Medicine

## 2020-12-16 VITALS — BP 142/90 | HR 90 | Ht 67.0 in | Wt 198.4 lb

## 2020-12-16 DIAGNOSIS — Z13220 Encounter for screening for lipoid disorders: Secondary | ICD-10-CM

## 2020-12-16 DIAGNOSIS — R079 Chest pain, unspecified: Secondary | ICD-10-CM

## 2020-12-16 NOTE — Patient Instructions (Signed)
Medication Instructions:  Your physician recommends that you continue on your current medications as directed. Please refer to the Current Medication list given to you today.  *If you need a refill on your cardiac medications before your next appointment, please call your pharmacy*   Lab Work: Your physician recommends that you return for lab work in: Today    If you have labs (blood work) drawn today and your tests are completely normal, you will receive your results only by: MyChart Message (if you have MyChart) OR A paper copy in the mail If you have any lab test that is abnormal or we need to change your treatment, we will call you to review the results.   Testing/Procedures: Your physician has requested that you have an echocardiogram. Echocardiography is a painless test that uses sound waves to create images of your heart. It provides your doctor with information about the size and shape of your heart and how well your heart's chambers and valves are working. This procedure takes approximately one hour. There are no restrictions for this procedure.    Follow-Up: At [facility], you and your health needs are our priority.  As part of our continuing mission to provide you with exceptional heart care, we have created designated Provider Care Teams.  These Care Teams include your primary Cardiologist (physician) and Advanced Practice Providers (APPs -  Physician Assistants and Nurse Practitioners) who all work together to provide you with the care you need, when you need it.  We recommend signing up for the patient portal called "MyChart".  Sign up information is provided on this After Visit Summary.  MyChart is used to connect with patients for Virtual Visits (Telemedicine).  Patients are able to view lab/test results, encounter notes, upcoming appointments, etc.  Non-urgent messages can be sent to your provider as well.   To learn more about what you can do with MyChart, go to  [email or web address].    Your next appointment:    Pending Test Results   The format for your next appointment:   In Person  Provider:   Paula Ross, MD   Other Instructions Thank you for choosing [facility]!    

## 2020-12-16 NOTE — Progress Notes (Signed)
Cardiology Office Note   Date:  12/16/2020   ID:  Shawna Stevens, DOB 1970/11/16, MRN [id number]  PCP:  Patient, No Pcp Per (Inactive)  Cardiologist:   Dietrich Pates, MD   Pt is referred for evaluation of CP      History of Present Illness: Shawna Stevens is a 50 y.o. female with a history of  PE (2014) and CP  She moved to [facility] from [place] and wanted to be evaluated  In March 2020 she was admtted to [facility] in [place] for CP   Pain substerna  Radiated to L arm   Some nausea   Started night before admit  Troponin was minimally elevated at 2.39 and 2.38   She had a VQ scan that showed mod probably of acute or subacute PE   Started on heparin  Helical CT one   Showed no filling defects    Echo done  LVEF and RVEF were normal  Mild LVH Stress myoview was  reported nnormal     Since then the pt has had some intermitt pains  "cramping" QUestoins if it is due to rheumatologic process   Episodes of L side pressure occur with and without activity   Does have a hx of reflucx  No FHx of CAD   Diet: Br   Egg sandwich Lunch:   Chicken noodle soup    Dinner:   [employer] and cool aid    Current Meds  Medication Sig   hydroxychloroquine (PLAQUENIL) 200 MG tablet TAKE 2 TABLETS BY MOUTH EVERY DAY   mirtazapine (REMERON) 15 MG tablet Take 1 tablet (15 mg total) by mouth at bedtime.   omeprazole (PRILOSEC) 20 MG capsule Take 1 capsule by mouth daily.   VITAMIN D, CHOLECALCIFEROL, PO Take by mouth.     Allergies:   Sulfa antibiotics   Past Medical History:  Diagnosis Date   Lupus (HCC)    Pulmonary embolism (HCC)    Rheumatoid aortitis    Sjogren's syndrome (HCC)     Past Surgical History:  Procedure Laterality Date   TONSILLECTOMY       Social History:  The patient  reports that she has never smoked. She has never used smokeless tobacco. She reports that she does not drink alcohol and does not use drugs.   Family History:  The patient's family history includes Thyroid  disease in her mother.    ROS:  Please see the history of present illness. All other systems are reviewed and  Negative to the above problem except as noted.    PHYSICAL EXAM: VS:  BP (!) 142/90   Pulse 90   Ht 5\' 7"  (1.702 m)   Wt 198 lb 6.4 oz (90 kg)   SpO2 97%   BMI 31.07 kg/m   GEN: Obese 50 yo in no acute distress  HEENT: normal  Neck: no JVD, carotid bruits Cardiac: RRR; no murmurs  No LE  edema  Respiratory:  clear to auscultation bilaterally GI: soft, nontender, nondistended, + BS  No hepatomegaly  MS: no deformity Moving all extremities   Skin: warm and dry, no rash Neuro:  Strength and sensation are intact Psych: euthymic mood, full affect   EKG:  EKG is not ordered today.   Lipid Panel No results found for: CHOL, TRIG, HDL, CHOLHDL, VLDL, LDLCALC, LDLDIRECT    Wt Readings from Last 3 Encounters:  12/16/20 198 lb 6.4 oz (90 kg)  10/18/20 195 lb  9.6 oz (88.7 kg)  07/20/20 195 lb (88.5 kg)      ASSESSMENT AND PLAN:  1  Chest pain   Pt with intermitt Chest pains   They do not sound typical for angina   She was seen in 2020 in [place]   Troponin was minimally elevated  at 23 x 2   I am not sure significance of theis  Stress test normal   Echo with normal LVEF  Mild LVH I would recomm getting echo to reeval LV anatomy and function    I am no convinced of CAD   Will review echo  2  Hx PE   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  The patient does not have concerns regarding medicines.  Signed, Dietrich Pates, MD  12/16/2020 11:08 AM    [facility] [address] [place], [place], [place]  [address] Phone: [phone]; Fax: [phone]

## 2020-12-17 LAB — NMR, LIPOPROFILE
Cholesterol, Total: 180 mg/dL (ref 100–199)
HDL Cholesterol by NMR: 64 mg/dL (ref >39)
HDL Particle Number: 33.9 umol/L (ref >=30.5)
LDL Particle Number: 863 nmol/L (ref <1000)
LDL Size: 22 nm (ref >20.5)
LDL-C (NIH Calc): 102 mg/dL — ABNORMAL HIGH (ref 0–99)
LP-IR Score: <25 (ref <=45)
Small LDL Particle Number: <90 nmol/L (ref <=527)
Triglycerides by NMR: 73 mg/dL (ref 0–149)

## 2020-12-17 LAB — MISC LABCORP TEST (SEND OUT): Labcorp test code: 167015

## 2020-12-17 LAB — LIPOPROTEIN A (LPA): Lipoprotein (a): 234 nmol/L — ABNORMAL HIGH (ref <75.0)

## 2020-12-30 ENCOUNTER — Ambulatory Visit (HOSPITAL_COMMUNITY)
Admission: RE | Admit: 2020-12-30 | Discharge: 2020-12-30 | Disposition: A | Discharge status: Home / Self Care | Source: Ambulatory Visit | Attending: Internal Medicine | Admitting: Internal Medicine

## 2020-12-30 ENCOUNTER — Other Ambulatory Visit: Payer: Self-pay

## 2020-12-30 DIAGNOSIS — I340 Nonrheumatic mitral (valve) insufficiency: Secondary | ICD-10-CM

## 2020-12-30 DIAGNOSIS — R079 Chest pain, unspecified: Secondary | ICD-10-CM | POA: Diagnosis not present

## 2020-12-30 LAB — ECHOCARDIOGRAM COMPLETE
Area-P 1/2: 6.83 cm2
S' Lateral: 2.9 cm
Single Plane A4C EF: 56.1 %

## 2020-12-30 NOTE — Progress Notes (Signed)
  Echocardiogram 2D Echocardiogram has been performed.  Shawna Stevens 12/30/2020, 8:57 AM

## 2021-01-03 ENCOUNTER — Telehealth: Payer: Self-pay | Admitting: *Deleted

## 2021-01-03 DIAGNOSIS — Z86711 Personal history of pulmonary embolism: Secondary | ICD-10-CM

## 2021-01-03 NOTE — Telephone Encounter (Signed)
Called patient with test results. No answer. Left message to call back.  

## 2021-01-03 NOTE — Telephone Encounter (Signed)
-----   Message from Dietrich Pates V, MD sent at 01/02/2021  7:16 PM EDT ----- Echo is normal   Pumping function of the right and left ventricles are normal  With hx of PE in past (unprovoked that I see) she needs to have hypercoagulable panel done to r/o risk for recurrence

## 2021-01-06 ENCOUNTER — Other Ambulatory Visit (HOSPITAL_COMMUNITY)
Admission: RE | Admit: 2021-01-06 | Discharge: 2021-01-06 | Disposition: A | Discharge status: Home / Self Care | Source: Ambulatory Visit | Attending: Internal Medicine | Admitting: Internal Medicine

## 2021-01-06 DIAGNOSIS — Z86711 Personal history of pulmonary embolism: Secondary | ICD-10-CM

## 2021-01-07 LAB — CARDIOLIPIN ANTIBODIES, IGG, IGM, IGA
Anticardiolipin IgA: <9 APL U/mL (ref 0–11)
Anticardiolipin IgG: <9 GPL U/mL (ref 0–14)
Anticardiolipin IgM: <9 MPL U/mL (ref 0–12)

## 2021-01-07 LAB — PROTEIN C, TOTAL: Protein C, Total: 120 % (ref 60–150)

## 2021-01-07 LAB — ANTITHROMBIN III: AntiThromb III Func: 104 % (ref 75–120)

## 2021-01-08 LAB — BETA-2-GLYCOPROTEIN I ABS, IGG/M/A
Beta-2 Glyco I IgG: <9 GPI IgG units (ref 0–20)
Beta-2-Glycoprotein I IgA: <9 GPI IgA units (ref 0–25)
Beta-2-Glycoprotein I IgM: <9 GPI IgM units (ref 0–32)

## 2021-01-08 LAB — HOMOCYSTEINE: Homocysteine: 11.1 umol/L (ref 0.0–14.5)

## 2021-01-09 LAB — PROTEIN S, TOTAL: Protein S Ag, Total: 92 % (ref 60–150)

## 2021-01-09 LAB — LUPUS ANTICOAGULANT PANEL
DRVVT: 35 s (ref 0.0–47.0)
PTT Lupus Anticoagulant: 31.1 s (ref 0.0–51.9)

## 2021-01-09 LAB — PROTEIN S ACTIVITY: Protein S Activity: 69 % (ref 63–140)

## 2021-01-09 LAB — PROTEIN C ACTIVITY: Protein C Activity: 125 % (ref 73–180)

## 2021-01-11 LAB — FACTOR 5 LEIDEN

## 2021-01-12 ENCOUNTER — Encounter: Payer: Self-pay | Admitting: *Deleted

## 2021-01-12 LAB — PROTHROMBIN GENE MUTATION

## 2021-01-18 NOTE — Telephone Encounter (Signed)
Patient returning call to Kisha 

## 2021-01-18 NOTE — Telephone Encounter (Signed)
Patient had already discussed lab results, letter was received after that fact.

## 2021-03-10 ENCOUNTER — Emergency Department (HOSPITAL_COMMUNITY)
Admission: EM | Admit: 2021-03-10 | Discharge: 2021-03-11 | Disposition: A | Discharge status: Home / Self Care | Attending: Emergency Medicine | Admitting: Emergency Medicine

## 2021-03-10 ENCOUNTER — Encounter (HOSPITAL_COMMUNITY): Payer: Self-pay

## 2021-03-10 ENCOUNTER — Emergency Department (HOSPITAL_COMMUNITY)

## 2021-03-10 ENCOUNTER — Other Ambulatory Visit: Payer: Self-pay

## 2021-03-10 DIAGNOSIS — R109 Unspecified abdominal pain: Secondary | ICD-10-CM | POA: Diagnosis present

## 2021-03-10 DIAGNOSIS — N3000 Acute cystitis without hematuria: Secondary | ICD-10-CM | POA: Diagnosis not present

## 2021-03-10 LAB — BASIC METABOLIC PANEL
Anion gap: 8 (ref 5–15)
BUN: 9 mg/dL (ref 6–20)
CO2: 24 mmol/L (ref 22–32)
Calcium: 8.9 mg/dL (ref 8.9–10.3)
Chloride: 105 mmol/L (ref 98–111)
Creatinine, Ser: 0.82 mg/dL (ref 0.44–1.00)
GFR, Estimated: >60 mL/min (ref >60)
Glucose, Bld: 77 mg/dL (ref 70–99)
Potassium: 4.3 mmol/L (ref 3.5–5.1)
Sodium: 137 mmol/L (ref 135–145)

## 2021-03-10 LAB — URINALYSIS, ROUTINE W REFLEX MICROSCOPIC
Bilirubin Urine: NEGATIVE
Glucose, UA: NEGATIVE mg/dL
Hgb urine dipstick: NEGATIVE
Ketones, ur: NEGATIVE mg/dL
Nitrite: NEGATIVE
Protein, ur: NEGATIVE mg/dL
Specific Gravity, Urine: 1.02 (ref 1.005–1.030)
pH: 5 (ref 5.0–8.0)

## 2021-03-10 LAB — CBC WITH DIFFERENTIAL/PLATELET
Abs Immature Granulocytes: 0.03 10*3/uL (ref 0.00–0.07)
Basophils Absolute: 0.1 10*3/uL (ref 0.0–0.1)
Basophils Relative: 1 %
Eosinophils Absolute: 0.2 10*3/uL (ref 0.0–0.5)
Eosinophils Relative: 2 %
HCT: 42.2 % (ref 36.0–46.0)
Hemoglobin: 13.7 g/dL (ref 12.0–15.0)
Immature Granulocytes: 0 %
Lymphocytes Relative: 35 %
Lymphs Abs: 2.6 10*3/uL (ref 0.7–4.0)
MCH: 28.8 pg (ref 26.0–34.0)
MCHC: 32.5 g/dL (ref 30.0–36.0)
MCV: 88.8 fL (ref 80.0–100.0)
Monocytes Absolute: 0.5 10*3/uL (ref 0.1–1.0)
Monocytes Relative: 7 %
Neutro Abs: 4.1 10*3/uL (ref 1.7–7.7)
Neutrophils Relative %: 55 %
Platelets: 237 10*3/uL (ref 150–400)
RBC: 4.75 MIL/uL (ref 3.87–5.11)
RDW: 14 % (ref 11.5–15.5)
WBC: 7.5 10*3/uL (ref 4.0–10.5)
nRBC: 0 % (ref 0.0–0.2)

## 2021-03-10 LAB — PREGNANCY, URINE: Preg Test, Ur: NEGATIVE

## 2021-03-10 NOTE — ED Triage Notes (Signed)
Pt arrived via POV c/o recurrent UTI's and can't see her PCP for over a week. Pt reports right flank pain. Denies dysuria.

## 2021-03-10 NOTE — ED Notes (Signed)
Pt back from CT

## 2021-03-10 NOTE — ED Provider Notes (Addendum)
[facility] EMERGENCY DEPARTMENT Provider Note   CSN: [id number] Arrival date & time: 03/10/21  1953     History Chief Complaint  Patient presents with   Recurrent UTI    Shawna Stevens is a 50 y.o. female.  Patient with complaint of right flank pain for 2 days.  Also some discomfort with urination.  Patient's had a history of kidney stones.  But she is also concerned that she could have a urinary tract infection she gets those frequently.  No nausea no vomiting.  Past medical history significant for lupus [place]n syndrome.      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     Family History  Problem Relation Age of Onset   Thyroid disease Mother     Social History   Tobacco Use   Smoking status: Never   Smokeless tobacco: Never  Vaping Use   Vaping Use: Never used  Substance Use Topics   Alcohol use: No   Drug use: No    Home Medications Prior to Admission medications   Medication Sig Start Date End Date Taking? Authorizing Provider  cephALEXin (KEFLEX) 500 MG capsule Take 1 capsule (500 mg total) by mouth 4 (four) times daily. 03/11/21  Yes Vanetta Mulders, MD  hydroxychloroquine (PLAQUENIL) 200 MG tablet TAKE 2 TABLETS BY MOUTH EVERY DAY 10/18/20   Rice, Jamesetta Orleans, MD  mirtazapine  (REMERON) 15 MG tablet Take 1 tablet (15 mg total) by mouth at bedtime. 10/21/16   Benjiman Core, MD  omeprazole (PRILOSEC) 20 MG capsule Take 1 capsule by mouth daily. 10/06/20   [provider]  VITAMIN D, CHOLECALCIFEROL, PO Take by mouth.    [provider]    Allergies    Sulfa antibiotics  Review of Systems   Review of Systems  Constitutional:  Negative for chills and fever.  HENT:  Negative for ear pain and sore throat.   Eyes:  Negative for pain and visual disturbance.  Respiratory:  Negative for cough and shortness of breath.   Cardiovascular:  Negative for chest pain and palpitations.  Gastrointestinal:  Negative for abdominal pain and vomiting.  Genitourinary:  Positive for dysuria and flank pain. Negative for hematuria.  Musculoskeletal:  Negative for arthralgias and back pain.  Skin:  Negative for color change and rash.  Neurological:  Negative for seizures and syncope.  All other systems reviewed and are negative.  Physical Exam Updated Vital Signs BP (!) 149/99 (BP Location: Right Arm)   Pulse 88   Temp 98.5 F (36.9 C) (Oral)   Resp 16   Ht 1.702 m (5\' 7" )   Wt 86.2 kg   SpO2 100%   BMI 29.76 kg/m   Physical Exam Vitals and nursing note reviewed.  Constitutional:  General: She is not in acute distress.    Appearance: Normal appearance. She is well-developed.  HENT:     Head: Normocephalic and atraumatic.  Eyes:     Extraocular Movements: Extraocular movements intact.     Conjunctiva/sclera: Conjunctivae normal.     Pupils: Pupils are equal, round, and reactive to light.  Cardiovascular:     Rate and Rhythm: Normal rate and regular rhythm.     Heart sounds: No murmur heard. Pulmonary:     Effort: Pulmonary effort is normal. No respiratory distress.     Breath sounds: Normal breath sounds.  Abdominal:     General: There is no distension.     Palpations: Abdomen is soft.     Tenderness: There is no abdominal tenderness.   Musculoskeletal:        General: No swelling. Normal range of motion.     Cervical back: Normal range of motion and neck supple.  Skin:    General: Skin is warm and dry.     Capillary Refill: Capillary refill takes less than 2 seconds.  Neurological:     General: No focal deficit present.     Mental Status: She is alert and oriented to person, place, and time.    ED Results / Procedures / Treatments   Labs (all labs ordered are listed, but only abnormal results are displayed) Labs Reviewed  URINALYSIS, ROUTINE W REFLEX MICROSCOPIC - Abnormal; Notable for the following components:      Result Value   APPearance HAZY (*)    Leukocytes,Ua MODERATE (*)    Bacteria, UA RARE (*)    All other components within normal limits  URINE CULTURE  CBC WITH DIFFERENTIAL/PLATELET  BASIC METABOLIC PANEL  PREGNANCY, URINE    EKG None  Radiology CT Renal Stone Study  Result Date: 03/10/2021 CLINICAL DATA:  Right-sided flank pain EXAM: CT ABDOMEN AND PELVIS WITHOUT CONTRAST TECHNIQUE: Multidetector CT imaging of the abdomen and pelvis was performed following the standard protocol without IV contrast. COMPARISON:  None. FINDINGS: Lower chest: No acute abnormality. Hepatobiliary: No focal liver abnormality is seen. No gallstones, gallbladder wall thickening, or biliary dilatation. Pancreas: Unremarkable. No pancreatic ductal dilatation or surrounding inflammatory changes. Spleen: Normal in size without focal abnormality. Adrenals/Urinary Tract: Adrenal glands are normal. Punctate stone mid left kidney. No significant hydronephrosis or hydroureter. No ureteral stone. The bladder is normal Stomach/Bowel: Stomach is within normal limits. Appendix appears normal. No evidence of bowel wall thickening, distention, or inflammatory changes. Vascular/Lymphatic: No significant vascular findings are present. No enlarged abdominal or pelvic lymph nodes. Reproductive: Slightly bulky appearing uterus without discrete  mass. No adnexal mass lesion. Other: Negative for pelvic effusion or free air Musculoskeletal: No acute or significant osseous findings. IMPRESSION: 1. Negative for hydronephrosis or ureteral stone. 2. No CT evidence for acute intra-abdominopelvic abnormality. Electronically Signed   By: Jasmine Pang M.D.   On: 03/10/2021 23:15    Procedures Procedures   Medications Ordered in ED Medications  cephALEXin (KEFLEX) capsule 500 mg (has no administration in time range)    ED Course  I have reviewed the triage vital signs and the nursing notes.  Pertinent labs & imaging results that were available during my care of the patient were reviewed by me and considered in my medical decision making (see chart for details).    MDM Rules/Calculators/A&P  Patient's urinalysis white blood cells are 21-50.  Bacteria is rare.  But there are calcium oxalate crystals.  This is concerning for kidney stone.  Will get CT renal.  Urine culture has been sent.  Basic labs are pending.  CT scan negative for any stones.  We will go ahead and treat for urinary tract infection.  Will give first dose of Keflex here.  Have patient follow-up with primary care doctor and continue Keflex as a outpatient.  Urine sent for culture.  Final Clinical Impression(s) / ED Diagnoses Final diagnoses:  Flank pain  Acute cystitis without hematuria    Rx / DC Orders ED Discharge Orders          Ordered    cephALEXin (KEFLEX) 500 MG capsule  4 times daily        03/11/21 0033             Vanetta Mulders, MD 03/10/21 2200    Vanetta Mulders, MD 03/11/21 [phone]

## 2021-03-11 MED ORDER — CEPHALEXIN 500 MG PO CAPS: 500.0000 mg | ORAL_CAPSULE | Freq: Four times a day (QID) | ORAL | 0 refills | Status: DC

## 2021-03-11 MED ORDER — CEPHALEXIN 500 MG PO CAPS
500.0000 mg | ORAL_CAPSULE | Freq: Once | ORAL | Status: AC
  Administered 2021-03-11: 500 mg via ORAL
  Filled 2021-03-11: qty 1

## 2021-03-11 NOTE — Discharge Instructions (Signed)
Take the Keflex as directed.  Follow-up with your primary care doctor.  No evidence of any kidney stones.  Urine cultures pending to try to confirm the urinary tract infection.  But we will treat you as if you have a urinary tract infection.

## 2021-03-12 LAB — URINE CULTURE

## 2021-04-12 ENCOUNTER — Other Ambulatory Visit: Payer: Self-pay | Admitting: Internal Medicine

## 2021-04-12 DIAGNOSIS — M0579 Rheumatoid arthritis with rheumatoid factor of multiple sites without organ or systems involvement: Secondary | ICD-10-CM

## 2021-04-17 NOTE — Progress Notes (Signed)
Office Visit Note  Patient: Shawna Stevens             Date of Birth: Apr 04, 1971           MRN: [id number]             PCP: Rosita Fire, MD Referring: No ref. provider found Visit Date: 04/18/2021   Subjective:  Follow-up (Doing good)   History of Present Illness: Shawna Stevens is a 50 y.o. 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 She visited the ED 03/10/21 with flank pain wrkup at the time showed blood, calcium oxalate crystals on UA but CT renal tone study was negative so took antibiotics for suspected cystitis. She feels well in regards to arthritis no swelling or major pain complaints. She has been taking the vitamin at increased supplement dose of 3000 units daily. She saw Dr. Harrington Challenger for echocardiogram in July which looked good no reduced ejection fraction no increased estimated RVSP.  Previous HPI 10/18/20 Shawna Stevens is a 50 y.o. 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  Since last visit she was ill with an infection and discontinued her medications temporarily for that but afterwards only resumed the hydroxychloroquine.  Since stopping the methotrexate few weeks ago she has not noticed any particular worsening of symptoms.  07/20/20 Shawna Stevens is a 50 y.o. 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 She is a previous patient of Dr. Estanislado Pandy. She left this practice due to living for a while in [place] and in [place] but has moved back to this area and reestablishing care. She feels symptoms have been very wll controlled on methotrexate hydroxychloroquine. She has persistent left wrist limited movement but otherwise no new joint problems since before. She is not sure about last ophthalmology exam for retinal toxicity  screening.    Review of Systems  Constitutional:  Negative for fatigue.  HENT:  Positive for mouth dryness.   Eyes:  Positive for dryness.  Respiratory:  Negative for shortness of breath.   Cardiovascular:  Negative for swelling in legs/feet.  Gastrointestinal:  Negative for constipation.  Endocrine: Positive for heat intolerance.  Genitourinary:  Negative for difficulty urinating.  Musculoskeletal:  Negative for joint pain and joint pain.  Skin:  Positive for rash.  Allergic/Immunologic: Negative for susceptible to infections.  Neurological:  Negative for numbness.  Hematological:  Negative for bruising/bleeding tendency.  Psychiatric/Behavioral:  Positive for sleep disturbance.    PMFS History:  Patient Active Problem List   Diagnosis Date Noted   Vitamin D deficiency 10/18/2020   Recurrent UTI 12/27/2016   Other fatigue 11/29/2016   Alopecia 11/29/2016   Photosensitivity 11/29/2016   High risk medication use 11/29/2016   Rheumatoid arthritis involving multiple sites with positive rheumatoid factor ([place]) 10/08/2016   Systemic lupus erythematosus ([place]) 10/08/2016   History of pulmonary embolism 10/08/2016   Pulmonary embolism ([place]) 06/03/2013    Past Medical History:  Diagnosis Date   Lupus ([place])    Pulmonary embolism (HCC)    Rheumatoid aortitis    Rheumatoid arthritis (HCC)    Sjogren's syndrome (HCC)    Systemic lupus erythematosus (HCC)     Family History  Problem Relation Age of Onset   Thyroid disease Mother    Past Surgical History:  Procedure Laterality Date   TONSILLECTOMY     Social History   Social History Narrative   Not on file    There is no immunization history on file for this patient.   Objective: Vital Signs: BP 127/82 (BP Location: Right Arm, Patient Position: Sitting, Cuff Size: Normal)   Pulse 92   Resp 15   Ht _0  (1.702 m)   Wt 206 lb (93.4 kg)   BMI 32.26 kg/m    Physical Exam Constitutional:      Appearance: She is  obese.  Eyes:     Conjunctiva/sclera: Conjunctivae normal.  Cardiovascular:     Rate and Rhythm: Normal rate and regular rhythm.  Pulmonary:     Effort: Pulmonary effort is normal.     Breath sounds: Normal breath sounds.  Skin:    General: Skin is warm and dry.     Findings: No rash.  Neurological:     Mental Status: She is alert.     Deep Tendon Reflexes: Reflexes normal.  Psychiatric:        Mood and Affect: Mood normal.     Musculoskeletal Exam:  Shoulders full ROM no tenderness or swelling Elbows no tenderness or swelling, left elbow slightly decreased flexion ROM Wrists no tenderness or swelling, right wrist severely reduced flexion and extension ROM Fingers full ROM no tenderness or swelling No flank or CVA tenderness to pressure or percussion Knees full ROM no tenderness or swelling Ankles full ROM no tenderness or swelling   CDAI Exam: CDAI Score: 0  Patient Global: 0 mm; Provider Global: 0 mm Swollen: 0 ; Tender: 0  Joint Exam 04/18/2021   All documented joints were normal     Investigation: No additional findings.  Imaging: No results found.  Recent Labs: Lab Results  Component Value Date   WBC 7.5 03/10/2021   HGB 13.7 03/10/2021   PLT 237 03/10/2021   NA 137 03/10/2021   K 4.3 03/10/2021   CL 105 03/10/2021   CO2 24 03/10/2021   GLUCOSE 77 03/10/2021   BUN 9 03/10/2021   CREATININE 0.82 03/10/2021   BILITOT 0.4 10/18/2020   ALKPHOS 92 01/24/2017   AST 16 10/18/2020   ALT 9 10/18/2020   PROT 7.9 10/18/2020   ALBUMIN 3.7 01/24/2017   CALCIUM 8.9 03/10/2021   GFRAA 93 10/18/2020    Speciality Comments: PLQ eye exam- [facility]  No toxicity 09/08/2020, f/u 1 year  Procedures:  No procedures performed Allergies: Sulfa antibiotics   Assessment / Plan:     Visit Diagnoses: Other systemic lupus erythematosus with other organ involvement (HCC)  Rheumatoid arthritis involving multiple sites with positive rheumatoid factor  ([place]) - Plan: Sedimentation rate, Anti-DNA antibody, double-stranded  No particular signs of joint or skin inflammation on exam at this time and she reports symptoms have been doing well recently.  We will recheck sedimentation rate and double-stranded DNA for disease monitoring.  Anticipate continuing hydroxychloroquine 400 mg p.o. daily can follow-up this treatment in 100-monthinterval.  High risk medication use  Recent CBC reviewed no significant abnormality, no symptomatic complaints.  Vitamin D deficiency - Plan: VITAMIN D 25 Hydroxy (Vit-D Deficiency, Fractures)  Mild vitamin D deficiency demonstrated on April labs she increase her supplementation from 2000 to 3000 units daily we will recheck vitamin D today.  If she is now above goal can reduce back to 2000 unit daily supplement.  Orders: Orders Placed This Encounter  Procedures   Sedimentation rate  Anti-DNA antibody, double-stranded   VITAMIN D 25 Hydroxy (Vit-D Deficiency, Fractures)    No orders of the defined types were placed in this encounter.    Follow-Up Instructions: Return in about 6 months (around 10/17/2021) for RA/SLE on HCQ f/u 59mo.   CCollier Salina MD  Note - This record has been created using [employer]  Chart creation errors have been sought, but may not always  have been located. Such creation errors do not reflect on  the standard of medical care.

## 2021-04-18 ENCOUNTER — Encounter: Payer: Self-pay | Admitting: Internal Medicine

## 2021-04-18 ENCOUNTER — Ambulatory Visit (INDEPENDENT_AMBULATORY_CARE_PROVIDER_SITE_OTHER): Admitting: Internal Medicine

## 2021-04-18 ENCOUNTER — Other Ambulatory Visit: Payer: Self-pay

## 2021-04-18 VITALS — BP 127/82 | HR 92 | Resp 15 | Ht 67.0 in | Wt 206.0 lb

## 2021-04-18 DIAGNOSIS — M3219 Other organ or system involvement in systemic lupus erythematosus: Secondary | ICD-10-CM

## 2021-04-18 DIAGNOSIS — E559 Vitamin D deficiency, unspecified: Secondary | ICD-10-CM | POA: Diagnosis not present

## 2021-04-18 DIAGNOSIS — M0579 Rheumatoid arthritis with rheumatoid factor of multiple sites without organ or systems involvement: Secondary | ICD-10-CM | POA: Diagnosis not present

## 2021-04-18 DIAGNOSIS — Z79899 Other long term (current) drug therapy: Secondary | ICD-10-CM | POA: Diagnosis not present

## 2021-04-19 LAB — VITAMIN D 25 HYDROXY (VIT D DEFICIENCY, FRACTURES): Vit D, 25-Hydroxy: 41 ng/mL (ref 30–100)

## 2021-04-19 LAB — ANTI-DNA ANTIBODY, DOUBLE-STRANDED: ds DNA Ab: 7 IU/mL — ABNORMAL HIGH

## 2021-04-19 LAB — SEDIMENTATION RATE: Sed Rate: 41 mm/h — ABNORMAL HIGH (ref 0–20)

## 2021-04-19 NOTE — Progress Notes (Signed)
Lab results show sedimentation rate 41 down from 48 when checked 6 months ago and double stranded DNA test is 7 down from 10 which is good. Vitamin D level is up to 43 from 27 with a goal over 30, she can decrease to taking 2000 units daily for maintenance.

## 2021-06-27 ENCOUNTER — Other Ambulatory Visit (HOSPITAL_COMMUNITY): Payer: Self-pay | Admitting: Internal Medicine

## 2021-06-27 DIAGNOSIS — Z1231 Encounter for screening mammogram for malignant neoplasm of breast: Secondary | ICD-10-CM

## 2021-07-05 ENCOUNTER — Ambulatory Visit (HOSPITAL_COMMUNITY)
Admission: RE | Admit: 2021-07-05 | Discharge: 2021-07-05 | Disposition: A | Discharge status: Home / Self Care | Source: Ambulatory Visit | Attending: Internal Medicine | Admitting: Internal Medicine

## 2021-07-05 ENCOUNTER — Other Ambulatory Visit: Payer: Self-pay

## 2021-07-05 DIAGNOSIS — Z1231 Encounter for screening mammogram for malignant neoplasm of breast: Secondary | ICD-10-CM | POA: Insufficient documentation

## 2021-07-06 ENCOUNTER — Other Ambulatory Visit (HOSPITAL_COMMUNITY): Payer: Self-pay | Admitting: Internal Medicine

## 2021-07-06 DIAGNOSIS — R928 Other abnormal and inconclusive findings on diagnostic imaging of breast: Secondary | ICD-10-CM

## 2021-08-01 ENCOUNTER — Other Ambulatory Visit: Payer: Self-pay | Admitting: Internal Medicine

## 2021-08-01 ENCOUNTER — Ambulatory Visit (HOSPITAL_COMMUNITY)
Admission: RE | Admit: 2021-08-01 | Discharge: 2021-08-01 | Disposition: A | Discharge status: Home / Self Care | Source: Ambulatory Visit | Attending: Internal Medicine | Admitting: Internal Medicine

## 2021-08-01 ENCOUNTER — Other Ambulatory Visit: Payer: Self-pay

## 2021-08-01 DIAGNOSIS — R928 Other abnormal and inconclusive findings on diagnostic imaging of breast: Secondary | ICD-10-CM

## 2021-08-01 DIAGNOSIS — N6312 Unspecified lump in the right breast, upper inner quadrant: Secondary | ICD-10-CM

## 2021-08-08 ENCOUNTER — Other Ambulatory Visit: Payer: Self-pay

## 2021-08-08 ENCOUNTER — Ambulatory Visit (INDEPENDENT_AMBULATORY_CARE_PROVIDER_SITE_OTHER): Admitting: Adult Health

## 2021-08-08 ENCOUNTER — Encounter: Payer: Self-pay | Admitting: Adult Health

## 2021-08-08 ENCOUNTER — Other Ambulatory Visit (HOSPITAL_COMMUNITY)
Admission: RE | Admit: 2021-08-08 | Discharge: 2021-08-08 | Disposition: A | Discharge status: Home / Self Care | Source: Ambulatory Visit | Attending: Adult Health | Admitting: Adult Health

## 2021-08-08 VITALS — BP 133/85 | HR 110 | Ht 67.0 in | Wt 208.2 lb

## 2021-08-08 DIAGNOSIS — Z124 Encounter for screening for malignant neoplasm of cervix: Secondary | ICD-10-CM | POA: Insufficient documentation

## 2021-08-08 DIAGNOSIS — N951 Menopausal and female climacteric states: Secondary | ICD-10-CM

## 2021-08-08 DIAGNOSIS — Z1211 Encounter for screening for malignant neoplasm of colon: Secondary | ICD-10-CM | POA: Diagnosis not present

## 2021-08-08 LAB — HEMOCCULT GUIAC POC 1CARD (OFFICE): Fecal Occult Blood, POC: NEGATIVE

## 2021-08-08 NOTE — Progress Notes (Signed)
°  Subjective:     Patient ID: Shawna Stevens, female   DOB: [birth year], 51 y.o.   MRN: [id number]  HPI Shawna Stevens is a 51 year old female, married, [id number] in for pelvic and pap. She had physical with PCP in January. She is a new pt. PCP is Dr Felecia Shelling.   Review of Systems No period since July 2022 Has night sweats Denies any problems with sex, but drive as decreased    Reviewed past medical,surgical, social and family history. Reviewed medications and allergies.  Objective:   Physical Exam BP 133/85 (BP Location: Right Arm, Patient Position: Sitting, Cuff Size: Normal)    Pulse (!) 110    Ht 5\' 7"  (1.702 m)    Wt 208 lb 3.2 oz (94.4 kg)    LMP 01/09/2021 (Exact Date)    BMI 32.61 kg/m     Skin warm and dry.Pelvic: external genitalia is normal in appearance no lesions, vagina: pink and moist,urethra has no lesions or masses noted, cervix:smooth and bulbous,Pap with HR HPV genotyping performed, uterus: normal size, shape and contour, non tender, no masses felt, adnexa: no masses or tenderness noted. Bladder is non tender and no masses felt. On rectal exam, has good tone, no masses and hemoccult was negative. AA is 0 Fall risk is low Depression screen PHQ 2/9 08/08/2021  Decreased Interest 1  Down, Depressed, Hopeless 0  PHQ - 2 Score 1  Altered sleeping 1  Tired, decreased energy 1  Change in appetite 1  Feeling bad or failure about yourself  0  Trouble concentrating 0  Moving slowly or fidgety/restless 0  Suicidal thoughts 0  PHQ-9 Score 4    GAD 7 : Generalized Anxiety Score 08/08/2021  Nervous, Anxious, on Edge 0  Control/stop worrying 0  Worry too much - different things 0  Trouble relaxing 0  Restless 0  Easily annoyed or irritable 0  Afraid - awful might happen 0  Total GAD 7 Score 0      Upstream - 08/08/21 1429       Pregnancy Intention Screening   Does the patient want to become pregnant in the next year? N/A    Does the patient's partner want to become pregnant in the  next year? N/A    Would the patient like to discuss contraceptive options today? N/A      Contraception Wrap Up   Current Method Female Sterilization    End Method Female Sterilization    Contraception Counseling Provided No            Examination chaperoned by 08/10/21.  Assessment:     1. Routine cervical smear Pap sent Pap in 3 years if normal Physical and labs with PCP Has breast biopsy next week, from abnormal mammogram in January Has colonoscopy in February in 2020, repeat in 5 years she says   2. Encounter for screening fecal occult blood testing  3. Peri-menopause No period since July 2022, having night sweats Has history of PE,not candidate for HRT, but  could use SSRI if desired     Plan:     Pap in 3 years if normal

## 2021-08-11 LAB — CYTOLOGY - PAP
Comment: NEGATIVE
Diagnosis: NEGATIVE
High risk HPV: NEGATIVE

## 2021-08-16 ENCOUNTER — Ambulatory Visit
Admission: RE | Admit: 2021-08-16 | Discharge: 2021-08-16 | Disposition: A | Discharge status: Home / Self Care | Source: Ambulatory Visit | Attending: Internal Medicine | Admitting: Internal Medicine

## 2021-08-16 DIAGNOSIS — N6312 Unspecified lump in the right breast, upper inner quadrant: Secondary | ICD-10-CM

## 2021-10-08 ENCOUNTER — Other Ambulatory Visit: Payer: Self-pay | Admitting: Internal Medicine

## 2021-10-08 DIAGNOSIS — M0579 Rheumatoid arthritis with rheumatoid factor of multiple sites without organ or systems involvement: Secondary | ICD-10-CM

## 2021-10-16 NOTE — Progress Notes (Signed)
Office Visit Note  Patient: Shawna Stevens             Date of Birth: August 17, 1970           MRN: [id number]             PCP: Benetta Spar, MD Referring: Benetta Spar* Visit Date: 10/17/2021   Subjective:  Lupus (Doing good)   History of Present Illness: Shawna Stevens is a 51 y.o. 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 She feels joint pain and stiffness are doing well not much morning stiffness. No particular change in skin rashes but keeps some mild erythema. She has gained additional weight. She thinks there is some increased swelling of saliva glands but no pain or redness or change in mouth dryness.  Previous HPI 04/18/21  Shawna Stevens is a 51 y.o. 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 She visited the ED 03/10/21 with flank pain wrkup at the time showed blood, calcium oxalate crystals on UA but CT renal tone study was negative so took antibiotics for suspected cystitis. She feels well in regards to arthritis no swelling or major pain complaints. She has been taking the vitamin at increased supplement dose of 3000 units daily. She saw Dr. Tenny Craw for echocardiogram in July which looked good no reduced ejection fraction no increased estimated RVSP.   Previous HPI 07/20/20 Shawna Stevens is a 51 y.o. 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 She is a previous patient of Dr. Corliss Skains. She left this practice due to living for a while in [place] and in [place] but has moved back to this area and reestablishing care. She feels symptoms have been very wll controlled on methotrexate hydroxychloroquine. She has persistent left wrist limited movement but otherwise no new joint problems since before. She is not sure about last ophthalmology exam for retinal toxicity screening.    Review of Systems  Constitutional:  Positive for fatigue.  HENT:  Positive  for mouth dryness.   Eyes:  Positive for dryness.  Respiratory:  Negative for shortness of breath.   Cardiovascular:  Negative for swelling in legs/feet.  Gastrointestinal:  Negative for constipation.  Endocrine: Positive for heat intolerance and increased urination.  Genitourinary:  Negative for difficulty urinating.  Musculoskeletal:  Negative for joint pain and joint pain.  Skin:  Positive for rash.  Allergic/Immunologic: Negative for susceptible to infections.  Neurological:  Negative for numbness.  Hematological:  Negative for bruising/bleeding tendency.  Psychiatric/Behavioral:  Positive for sleep disturbance.    PMFS History:  Patient Active Problem List   Diagnosis Date Noted   Peri-menopause 08/08/2021   Encounter for screening fecal occult blood testing 08/08/2021   Routine cervical smear 08/08/2021   Vitamin D deficiency 10/18/2020   Recurrent UTI 12/27/2016   Other fatigue 11/29/2016   Alopecia 11/29/2016   Photosensitivity 11/29/2016   High risk medication use 11/29/2016   Rheumatoid arthritis involving multiple sites with positive rheumatoid factor (HCC) 10/08/2016   Systemic lupus erythematosus (HCC) 10/08/2016   History of pulmonary embolism 10/08/2016   Pulmonary embolism (HCC) 06/03/2013    Past Medical History:  Diagnosis Date   Lupus (HCC)    Pulmonary embolism (HCC)    Rheumatoid aortitis    Rheumatoid arthritis (HCC)    Sjogren's syndrome (HCC)    Systemic lupus erythematosus (HCC)     Family History  Problem Relation Age of Onset   Alzheimer's disease Paternal Grandmother    Thyroid disease Mother    Past Surgical History:  Procedure Laterality Date   TONSILLECTOMY     TUBAL LIGATION N/A 2006   Social History   Social History Narrative   Not on file    There is no immunization history on file for this patient.   Objective: Vital Signs: BP (!) 156/90 (BP Location: Left Arm, Patient Position: Sitting, Cuff Size: Normal)   Pulse 91    Resp 15   Ht 5\' 7"  (1.702 m)   Wt 209 lb (94.8 kg)   BMI 32.73 kg/m    Physical Exam HENT:     Head:     Comments: Mild swelling over bilateral preauricaular node or parotid gland, limited ultrasound exam showing mildly heterogenous glands, no cysts or fluid collection Cardiovascular:     Rate and Rhythm: Normal rate and regular rhythm.  Pulmonary:     Effort: Pulmonary effort is normal.     Breath sounds: Normal breath sounds.  Musculoskeletal:     Right lower leg: No edema.     Left lower leg: No edema.  Skin:    General: Skin is warm and dry.     Comments: Chronic hyperpigmented patches over elbows and forearms, right worse than left  Neurological:     Mental Status: She is alert.  Psychiatric:        Mood and Affect: Mood normal.     Musculoskeletal Exam:  Shoulders full ROM no tenderness or swelling Elbows full ROM no tenderness or swelling Wrists full ROM no tenderness or swelling Fingers full ROM no tenderness or swelling Knees full ROM no tenderness or swelling Ankles full ROM no tenderness or swelling   CDAI Exam: CDAI Score: 0  Patient Global: 0 mm; Provider Global: 0 mm Swollen: 0 ; Tender: 0  Joint Exam 10/17/2021   All documented joints were normal     Investigation: No additional findings.  Imaging: No results found.  Recent Labs: Lab Results  Component Value Date   WBC 7.5 03/10/2021   HGB 13.7 03/10/2021   PLT 237 03/10/2021   NA 137 03/10/2021   K 4.3 03/10/2021   CL 105 03/10/2021   CO2 24 03/10/2021   GLUCOSE 77 03/10/2021   BUN 9 03/10/2021   CREATININE 0.82 03/10/2021   BILITOT 0.4 10/18/2020   ALKPHOS 92 01/24/2017   AST 16 10/18/2020   ALT 9 10/18/2020   PROT 7.9 10/18/2020   ALBUMIN 3.7 01/24/2017   CALCIUM 8.9 03/10/2021   GFRAA 93 10/18/2020    Speciality Comments: PLQ eye exam- [facility]  No toxicity 09/08/2020, f/u 1 year  Procedures:  No procedures performed Allergies: Sulfa antibiotics    Assessment / Plan:     Visit Diagnoses: Rheumatoid arthritis involving multiple sites with positive rheumatoid factor (HCC)  Other systemic lupus erythematosus with other organ involvement (HCC) - Plan: Sedimentation rate, Anti-DNA antibody, double-stranded  RA and SLE overlap with joint and skin involvement and antibody positivity.  Symptoms appear to be doing well today on hydroxychloroquine 400 mg daily occasional as needed NSAID medications for breakthrough joint pain.  No severe flareup of skin inflammation or chronic changes.  We will recheck sedimentation rate and double-stranded ENA antibodies today for disease activity monitoring.  High risk medication use - Plan: CBC with Differential/Platelet  Checking CBC for medication monitoring on continued hydroxychloroquine last eye exam was about 1 year ago  and normal so needs to get this updated.  Also blood count for lupus disease activity monitoring.  Orders: Orders Placed This Encounter  Procedures   Sedimentation rate   Anti-DNA antibody, double-stranded   CBC with Differential/Platelet   No orders of the defined types were placed in this encounter.    Follow-Up Instructions: Return in about 6 months (around 04/18/2022) for RA/SLE on HCQ f/u 6 mos.   Fuller Plan, MD  Note - This record has been created using [employer].  Chart creation errors have been sought, but may not always  have been located. Such creation errors do not reflect on  the standard of medical care.

## 2021-10-17 ENCOUNTER — Ambulatory Visit (INDEPENDENT_AMBULATORY_CARE_PROVIDER_SITE_OTHER): Admitting: Internal Medicine

## 2021-10-17 ENCOUNTER — Encounter: Payer: Self-pay | Admitting: Internal Medicine

## 2021-10-17 VITALS — BP 156/90 | HR 91 | Resp 15 | Ht 67.0 in | Wt 209.0 lb

## 2021-10-17 DIAGNOSIS — M3219 Other organ or system involvement in systemic lupus erythematosus: Secondary | ICD-10-CM | POA: Diagnosis not present

## 2021-10-17 DIAGNOSIS — M0579 Rheumatoid arthritis with rheumatoid factor of multiple sites without organ or systems involvement: Secondary | ICD-10-CM | POA: Diagnosis not present

## 2021-10-17 DIAGNOSIS — Z79899 Other long term (current) drug therapy: Secondary | ICD-10-CM

## 2021-10-17 DIAGNOSIS — E559 Vitamin D deficiency, unspecified: Secondary | ICD-10-CM | POA: Diagnosis not present

## 2021-10-18 LAB — CBC WITH DIFFERENTIAL/PLATELET
Absolute Monocytes: 529 cells/uL (ref 200–950)
Basophils Absolute: 82 cells/uL (ref 0–200)
Basophils Relative: 1.3 %
Eosinophils Absolute: 202 cells/uL (ref 15–500)
Eosinophils Relative: 3.2 %
HCT: 40.3 % (ref 35.0–45.0)
Hemoglobin: 13.3 g/dL (ref 11.7–15.5)
Lymphs Abs: 2533 cells/uL (ref 850–3900)
MCH: 28.6 pg (ref 27.0–33.0)
MCHC: 33 g/dL (ref 32.0–36.0)
MCV: 86.7 fL (ref 80.0–100.0)
MPV: 14.4 fL — ABNORMAL HIGH (ref 7.5–12.5)
Monocytes Relative: 8.4 %
Neutro Abs: 2955 cells/uL (ref 1500–7800)
Neutrophils Relative %: 46.9 %
Platelets: 235 10*3/uL (ref 140–400)
RBC: 4.65 10*6/uL (ref 3.80–5.10)
RDW: 14.3 % (ref 11.0–15.0)
Total Lymphocyte: 40.2 %
WBC: 6.3 10*3/uL (ref 3.8–10.8)

## 2021-10-18 LAB — ANTI-DNA ANTIBODY, DOUBLE-STRANDED: ds DNA Ab: 5 IU/mL — ABNORMAL HIGH

## 2021-10-18 LAB — SEDIMENTATION RATE: Sed Rate: 34 mm/h — ABNORMAL HIGH (ref 0–20)

## 2021-10-19 NOTE — Progress Notes (Signed)
Labs look good her dsDNA and sed rate are both decreasing although still not normal. No problems with blood count she can continue hydroxychloroquine with no new changes.

## 2021-11-20 ENCOUNTER — Encounter (HOSPITAL_COMMUNITY): Payer: Self-pay | Admitting: Emergency Medicine

## 2021-11-20 ENCOUNTER — Other Ambulatory Visit: Payer: Self-pay

## 2021-11-20 ENCOUNTER — Emergency Department (HOSPITAL_COMMUNITY)
Admission: EM | Admit: 2021-11-20 | Discharge: 2021-11-20 | Disposition: A | Discharge status: Home / Self Care | Attending: Emergency Medicine | Admitting: Emergency Medicine

## 2021-11-20 ENCOUNTER — Emergency Department (HOSPITAL_COMMUNITY)

## 2021-11-20 DIAGNOSIS — R1012 Left upper quadrant pain: Secondary | ICD-10-CM | POA: Insufficient documentation

## 2021-11-20 LAB — URINALYSIS, ROUTINE W REFLEX MICROSCOPIC
Bilirubin Urine: NEGATIVE
Glucose, UA: NEGATIVE mg/dL
Hgb urine dipstick: NEGATIVE
Ketones, ur: NEGATIVE mg/dL
Nitrite: NEGATIVE
Protein, ur: NEGATIVE mg/dL
Specific Gravity, Urine: 1.018 (ref 1.005–1.030)
pH: 5 (ref 5.0–8.0)

## 2021-11-20 LAB — CBC WITH DIFFERENTIAL/PLATELET
Abs Immature Granulocytes: 0.01 10*3/uL (ref 0.00–0.07)
Basophils Absolute: 0.1 10*3/uL (ref 0.0–0.1)
Basophils Relative: 1 %
Eosinophils Absolute: 0.2 10*3/uL (ref 0.0–0.5)
Eosinophils Relative: 2 %
HCT: 38.1 % (ref 36.0–46.0)
Hemoglobin: 12.7 g/dL (ref 12.0–15.0)
Immature Granulocytes: 0 %
Lymphocytes Relative: 36 %
Lymphs Abs: 2.7 10*3/uL (ref 0.7–4.0)
MCH: 29.2 pg (ref 26.0–34.0)
MCHC: 33.3 g/dL (ref 30.0–36.0)
MCV: 87.6 fL (ref 80.0–100.0)
Monocytes Absolute: 0.7 10*3/uL (ref 0.1–1.0)
Monocytes Relative: 10 %
Neutro Abs: 3.7 10*3/uL (ref 1.7–7.7)
Neutrophils Relative %: 51 %
Platelets: 208 10*3/uL (ref 150–400)
RBC: 4.35 MIL/uL (ref 3.87–5.11)
RDW: 13.6 % (ref 11.5–15.5)
WBC: 7.3 10*3/uL (ref 4.0–10.5)
nRBC: 0 % (ref 0.0–0.2)

## 2021-11-20 LAB — COMPREHENSIVE METABOLIC PANEL
ALT: 20 U/L (ref 0–44)
AST: 24 U/L (ref 15–41)
Albumin: 3.9 g/dL (ref 3.5–5.0)
Alkaline Phosphatase: 129 U/L — ABNORMAL HIGH (ref 38–126)
Anion gap: 5 (ref 5–15)
BUN: 10 mg/dL (ref 6–20)
CO2: 24 mmol/L (ref 22–32)
Calcium: 8.9 mg/dL (ref 8.9–10.3)
Chloride: 110 mmol/L (ref 98–111)
Creatinine, Ser: 0.78 mg/dL (ref 0.44–1.00)
GFR, Estimated: >60 mL/min (ref >60)
Glucose, Bld: 95 mg/dL (ref 70–99)
Potassium: 3.5 mmol/L (ref 3.5–5.1)
Sodium: 139 mmol/L (ref 135–145)
Total Bilirubin: 0.1 mg/dL — ABNORMAL LOW (ref 0.3–1.2)
Total Protein: 7.6 g/dL (ref 6.5–8.1)

## 2021-11-20 LAB — LACTIC ACID, PLASMA: Lactic Acid, Venous: 1 mmol/L (ref 0.5–1.9)

## 2021-11-20 LAB — POC URINE PREG, ED: Preg Test, Ur: NEGATIVE

## 2021-11-20 LAB — LIPASE, BLOOD: Lipase: 42 U/L (ref 11–51)

## 2021-11-20 MED ORDER — SODIUM CHLORIDE 0.9 % IV SOLN: INTRAVENOUS | Status: DC

## 2021-11-20 MED ORDER — CEPHALEXIN 500 MG PO CAPS: 500.0000 mg | ORAL_CAPSULE | Freq: Three times a day (TID) | ORAL | 0 refills | Status: AC

## 2021-11-20 MED ORDER — NAPROXEN 500 MG PO TABS: 500.0000 mg | ORAL_TABLET | Freq: Two times a day (BID) | ORAL | 0 refills | Status: AC

## 2021-11-20 MED ORDER — IOHEXOL 300 MG/ML  SOLN
100.0000 mL | Freq: Once | INTRAMUSCULAR | Status: AC | PRN
  Administered 2021-11-20: 100 mL via INTRAVENOUS

## 2021-11-20 MED ORDER — ONDANSETRON HCL 4 MG/2ML IJ SOLN
4.0000 mg | Freq: Once | INTRAMUSCULAR | Status: DC
  Filled 2021-11-20: qty 2

## 2021-11-20 NOTE — ED Triage Notes (Addendum)
Patient c/o left upper abd pain that started Thursday and is worse with pressure. Denies any nausea, vomiting, or diarrhea. Unsure of fever but reports chills. Denies any urinary symptoms but states see has recurrent UTIs without symptoms and took some antibiotics she has left over just in case.

## 2021-11-20 NOTE — ED Provider Notes (Signed)
[facility] EMERGENCY DEPARTMENT Provider Note   CSN: [id number] Arrival date & time: 11/20/21  0932     History  Chief Complaint  Patient presents with   Abdominal Pain    Shawna Stevens is a 51 y.o. female.   Abdominal Pain  This patient is a 51 year old female, she has a history of Sjogren's syndrome, rheumatoid arthritis as well as lupus and has a history of some occasional urinary infections in the past.  She presents with approximately 4 days of left upper abdominal discomfort, she has felt generally poorly but cannot really describe what that means.  Not terribly nauseated, not having fevers, no urinary symptoms, no diarrhea, appetite has been slightly decreased and she has a general malaise.  Her only other symptom is some upper abdominal discomfort mostly on the left.  She took 3 doses of cephalexin which she had around the house starting yesterday but this has not really helped that much.  Home Medications Prior to Admission medications   Medication Sig Start Date End Date Taking? Authorizing Provider  cephALEXin (KEFLEX) 500 MG capsule Take 1 capsule (500 mg total) by mouth 3 (three) times daily for 7 days. 11/20/21 11/27/21 Yes Eber Hong, MD  hydroxychloroquine (PLAQUENIL) 200 MG tablet TAKE 2 TABLETS BY MOUTH EVERY DAY 10/09/21  Yes Rice, Jamesetta Orleans, MD  mirtazapine (REMERON) 7.5 MG tablet Take 7.5 mg by mouth at bedtime. 07/24/21  Yes [provider]  naproxen (NAPROSYN) 500 MG tablet Take 1 tablet (500 mg total) by mouth 2 (two) times daily with a meal. 11/20/21  Yes Eber Hong, MD  omeprazole (PRILOSEC) 20 MG capsule Take 1 capsule by mouth daily as needed (heartburn). 10/06/20  Yes [provider]      Allergies    Sulfa antibiotics    Review of Systems   Review of Systems  Gastrointestinal:  Positive for abdominal pain.  All other systems reviewed and are negative.  Physical Exam Updated Vital Signs BP 125/86   Pulse 84   Temp 98.4 F  (36.9 C) (Oral)   Resp 15   Ht 1.727 m (5\' 8" )   Wt 95.3 kg   SpO2 98%   BMI 31.93 kg/m  Physical Exam Vitals and nursing note reviewed.  Constitutional:      General: She is not in acute distress.    Appearance: She is well-developed.  HENT:     Head: Normocephalic and atraumatic.     Mouth/Throat:     Pharynx: No oropharyngeal exudate.  Eyes:     General: No scleral icterus.       Right eye: No discharge.        Left eye: No discharge.     Conjunctiva/sclera: Conjunctivae normal.     Pupils: Pupils are equal, round, and reactive to light.  Neck:     Thyroid: No thyromegaly.     Vascular: No JVD.  Cardiovascular:     Rate and Rhythm: Normal rate and regular rhythm.     Heart sounds: Normal heart sounds. No murmur heard.   No friction rub. No gallop.  Pulmonary:     Effort: Pulmonary effort is normal. No respiratory distress.     Breath sounds: Normal breath sounds. No wheezing or rales.  Abdominal:     General: Bowel sounds are normal. There is no distension.     Palpations: Abdomen is soft. There is no mass.     Tenderness: There is abdominal tenderness.     Comments: Mild  to moderate tenderness to palpation in the left upper quadrant and epigastrium, no other abdominal tenderness, no Murphy sign, no tenderness at McBurney's point, no CVA tenderness  Musculoskeletal:        General: No tenderness. Normal range of motion.     Cervical back: Normal range of motion and neck supple.  Lymphadenopathy:     Cervical: No cervical adenopathy.  Skin:    General: Skin is warm and dry.     Findings: No erythema or rash.  Neurological:     Mental Status: She is alert.     Coordination: Coordination normal.  Psychiatric:        Behavior: Behavior normal.    ED Results / Procedures / Treatments   Labs (all labs ordered are listed, but only abnormal results are displayed) Labs Reviewed  COMPREHENSIVE METABOLIC PANEL - Abnormal; Notable for the following components:       Result Value   Alkaline Phosphatase 129 (*)    Total Bilirubin 0.1 (*)    All other components within normal limits  URINALYSIS, ROUTINE W REFLEX MICROSCOPIC - Abnormal; Notable for the following components:   Leukocytes,Ua MODERATE (*)    Bacteria, UA RARE (*)    All other components within normal limits  LIPASE, BLOOD  CBC WITH DIFFERENTIAL/PLATELET  LACTIC ACID, PLASMA  POC URINE PREG, ED    EKG None  Radiology No results found.  Procedures Procedures    Medications Ordered in ED Medications  0.9 %  sodium chloride infusion ( Intravenous New Bag/Given 11/20/21 0824)  ondansetron (ZOFRAN) injection 4 mg (4 mg Intravenous Patient Refused/Not Given 11/20/21 0824)  iohexol (OMNIPAQUE) 300 MG/ML solution 100 mL (100 mLs Intravenous Contrast Given 11/20/21 4765)    ED Course/ Medical Decision Making/ A&P                           Medical Decision Making Amount and/or Complexity of Data Reviewed Labs: ordered. Radiology: ordered.  Risk Prescription drug management.   This patient presents to the ED for concern of abdominal discomfort and general malaise, this involves an extensive number of treatment options, and is a complaint that carries with it a high risk of complications and morbidity.  The differential diagnosis includes abnormalities with the spleen pancreas or gallbladder, could be gastrointestinal such as colitis, enteritis, less likely to be kidney related but could be urinary tract infection   Co morbidities that complicate the patient evaluation  Multiple autoimmune illnesses   Additional history obtained:  Additional history obtained from electronic medical record showing that the patient has been followed closely by rheumatology, most recently seen in April, not on prednisone at this time External records from outside source obtained and reviewed including office records, no recent admissions   Lab Tests:  I Ordered, and personally interpreted labs.   The pertinent results include: No significant leukocytosis or any abnormal findings other than a mild urinary tract possible infection   Imaging Studies ordered:  I ordered imaging studies including CT scan of the abdomen and pelvis I independently visualized and interpreted imaging which showed some fat necrosis, no other intra-abdominal pathology or signs of surgical causes of pain I agree with the radiologist interpretation   Cardiac Monitoring: / EKG:  The patient was maintained on a cardiac monitor.  I personally viewed and interpreted the cardiac monitored which showed an underlying rhythm of: Normal sinus rhythm   Consultations Obtained:  No consultations were indicated  Problem List / ED Course / Critical interventions / Medication management  Abdominal pain I ordered medication including Zofran and IV fluids for abdominal pain Reevaluation of the patient after these medicines showed that the patient improved I have reviewed the patients home medicines and have made adjustments as needed   Social Determinants of Health:  None   Test / Admission - Considered:  Considered admission but the patient is otherwise stable without any other complaints, CT scan does not reveal any signs of surgical cause, stable for discharge         Final Clinical Impression(s) / ED Diagnoses Final diagnoses:  Left upper quadrant abdominal pain    Rx / DC Orders ED Discharge Orders          Ordered    cephALEXin (KEFLEX) 500 MG capsule  3 times daily        11/20/21 1159    naproxen (NAPROSYN) 500 MG tablet  2 times daily with meals        11/20/21 1159              Eber HongMiller, Hartlyn Reigel, MD 11/20/21 1201

## 2021-11-20 NOTE — Discharge Instructions (Signed)
I have reviewed your CT scan with the radiologist and all of your blood work which was unremarkable and reassuring.  Though you may have a mild urinary tract infection the likely cause of your pain is that you have some abdominal fat which has been inflamed, this area is of really little concern and will get better all by itself but it may take a couple of weeks.  There was no surgical causes of your pain and everything else looked okay.  Please take Naprosyn, 500mg  by mouth twice daily as needed for pain - this in an antiinflammatory medicine (NSAID) and is similar to ibuprofen - many people feel that it is stronger than ibuprofen and it is easier to take since it is a smaller pill.  Please use this only for 1 week - if your pain persists, you will need to follow up with your doctor in the office for ongoing guidance and pain control.  Cephalexin 3 times daily for 7 days to finish treatment for the urinary tract infection  Thank you for allowing to treat you in the emergency department today.  After reviewing your examination and potential testing that was done it appears that you are safe to go home.  I would like for you to follow-up with your doctor within the next several days, have them obtain your results and follow-up with them to review all of these tests.  If you should develop severe or worsening symptoms return to the emergency department immediately

## 2021-11-20 NOTE — ED Notes (Signed)
Dr Hyacinth Meeker in to assess pt

## 2022-04-04 ENCOUNTER — Other Ambulatory Visit: Payer: Self-pay | Admitting: Internal Medicine

## 2022-04-04 DIAGNOSIS — M0579 Rheumatoid arthritis with rheumatoid factor of multiple sites without organ or systems involvement: Secondary | ICD-10-CM

## 2022-04-04 NOTE — Telephone Encounter (Signed)
Next Visit: 04/18/2022  Last Visit: 10/17/2021  Labs: 10/17/2021 Labs look good her dsDNA and sed rate are both decreasing although still not normal. No problems with blood count she can continue hydroxychloroquine with no new changes.  Eye exam: 09/08/2020   Current Dose per office note 10/17/2021: hydroxychloroquine 400 mg daily   [id number] arthritis involving multiple sites with positive rheumatoid factor   Last Fill: 10/09/2021  Left message informing patient that she is overdue for labs and PLQ Eye Exam  Okay to refill Plaquenil?

## 2022-04-13 NOTE — Progress Notes (Unsigned)
Office Visit Note  Patient: Shawna Stevens             Date of Birth: 1971-06-24           MRN: [id number]             PCP: Benetta Spar, MD Referring: Benetta Spar* Visit Date: 04/18/2022   Subjective:  No chief complaint on file.   History of Present Illness: Shawna Stevens is a 51 y.o. female here for follow up for RA/SLE overlap   Previous HPI 10/17/2021 [name] is a 51 y.o. 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 She feels joint pain and stiffness are doing well not much morning stiffness. No particular change in skin rashes but keeps some mild erythema. She has gained additional weight. She thinks there is some increased swelling of saliva glands but no pain or redness or change in mouth dryness.   Previous HPI 04/18/21  Shawna Stevens is a 51 y.o. 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 She visited the ED 03/10/21 with flank pain wrkup at the time showed blood, calcium oxalate crystals on UA but CT renal tone study was negative so took antibiotics for suspected cystitis. She feels well in regards to arthritis no swelling or major pain complaints. She has been taking the vitamin at increased supplement dose of 3000 units daily. She saw Dr. Tenny Craw for echocardiogram in July which looked good no reduced ejection fraction no increased estimated RVSP.   Previous HPI 07/20/20 Shawna Stevens is a 51 y.o. 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 She is a previous patient of Dr. Corliss Skains. She left this practice due to living for a while in [place] and in [place] but has moved back to this area and reestablishing care. She feels symptoms have been very wll controlled on methotrexate hydroxychloroquine. She has persistent left wrist limited movement but otherwise no new joint problems since before. She is not sure about last ophthalmology exam  for retinal toxicity screening.    No Rheumatology ROS completed.   PMFS History:  Patient Active Problem List   Diagnosis Date Noted   Peri-menopause 08/08/2021   Encounter for screening fecal occult blood testing 08/08/2021   Routine cervical smear 08/08/2021   Vitamin D deficiency 10/18/2020   Recurrent UTI 12/27/2016   Other fatigue 11/29/2016   Alopecia 11/29/2016   Photosensitivity 11/29/2016   High risk medication use 11/29/2016   Rheumatoid arthritis involving multiple sites with positive rheumatoid factor (HCC) 10/08/2016   Systemic lupus erythematosus (HCC) 10/08/2016   History of pulmonary embolism 10/08/2016   Pulmonary embolism (HCC) 06/03/2013    Past Medical History:  Diagnosis Date   Lupus (HCC)    Pulmonary embolism (HCC)    Rheumatoid aortitis    Rheumatoid arthritis (HCC)    Sjogren's syndrome (HCC)    Systemic lupus erythematosus (HCC)     Family History  Problem Relation Age of Onset   Alzheimer's disease Paternal Grandmother    Thyroid disease Mother    Past Surgical History:  Procedure Laterality Date   TONSILLECTOMY     TUBAL LIGATION N/A 2006   Social History   Social History Narrative   Not on file    There is no immunization history on file for this patient.   Objective: Vital Signs: There were no vitals taken for this visit.  Physical Exam   Musculoskeletal Exam: ***  CDAI Exam: CDAI Score: -- Patient Global: --; Provider Global: -- Swollen: --; Tender: -- Joint Exam 04/18/2022   No joint exam has been documented for this visit   There is currently no information documented on the homunculus. Go to the Rheumatology activity and complete the homunculus joint exam.  Investigation: No additional findings.  Imaging: No results found.  Recent Labs: Lab Results  Component Value Date   WBC 7.3 11/20/2021   HGB 12.7 11/20/2021   PLT 208 11/20/2021   NA 139 11/20/2021   K 3.5 11/20/2021   CL 110 11/20/2021   CO2 24  11/20/2021   GLUCOSE 95 11/20/2021   BUN 10 11/20/2021   CREATININE 0.78 11/20/2021   BILITOT 0.1 (L) 11/20/2021   ALKPHOS 129 (H) 11/20/2021   AST 24 11/20/2021   ALT 20 11/20/2021   PROT 7.6 11/20/2021   ALBUMIN 3.9 11/20/2021   CALCIUM 8.9 11/20/2021   GFRAA 93 10/18/2020    Speciality Comments: PLQ eye exam- [facility]  No toxicity 09/08/2020, f/u 1 year  Procedures:  No procedures performed Allergies: Sulfa antibiotics   Assessment / Plan:     Visit Diagnoses: No diagnosis found.  ***  Orders: No orders of the defined types were placed in this encounter.  No orders of the defined types were placed in this encounter.    Follow-Up Instructions: No follow-ups on file.   Bertram Savin, RT  Note - This record has been created using [employer].  Chart creation errors have been sought, but may not always  have been located. Such creation errors do not reflect on  the standard of medical care.

## 2022-04-18 ENCOUNTER — Ambulatory Visit: Attending: Internal Medicine | Admitting: Internal Medicine

## 2022-04-18 ENCOUNTER — Encounter: Payer: Self-pay | Admitting: Internal Medicine

## 2022-04-18 VITALS — BP 149/93 | HR 90 | Resp 14 | Ht 67.0 in | Wt 209.6 lb

## 2022-04-18 DIAGNOSIS — Z79899 Other long term (current) drug therapy: Secondary | ICD-10-CM

## 2022-04-18 DIAGNOSIS — M3219 Other organ or system involvement in systemic lupus erythematosus: Secondary | ICD-10-CM | POA: Diagnosis not present

## 2022-04-18 DIAGNOSIS — M0579 Rheumatoid arthritis with rheumatoid factor of multiple sites without organ or systems involvement: Secondary | ICD-10-CM | POA: Diagnosis not present

## 2022-04-18 MED ORDER — HYDROXYCHLOROQUINE SULFATE 200 MG PO TABS: 400.0000 mg | ORAL_TABLET | Freq: Every day | ORAL | 1 refills | Status: DC

## 2022-04-19 LAB — BASIC METABOLIC PANEL WITH GFR
BUN: 10 mg/dL (ref 7–25)
CO2: 26 mmol/L (ref 20–32)
Calcium: 9.9 mg/dL (ref 8.6–10.4)
Chloride: 108 mmol/L (ref 98–110)
Creat: 0.95 mg/dL (ref 0.50–1.03)
Glucose, Bld: 86 mg/dL (ref 65–99)
Potassium: 4.2 mmol/L (ref 3.5–5.3)
Sodium: 143 mmol/L (ref 135–146)
eGFR: 73 mL/min/{1.73_m2} (ref >=60)

## 2022-04-19 LAB — CBC WITH DIFFERENTIAL/PLATELET
Absolute Monocytes: 510 cells/uL (ref 200–950)
Basophils Absolute: 62 cells/uL (ref 0–200)
Basophils Relative: 1.2 %
Eosinophils Absolute: 187 cells/uL (ref 15–500)
Eosinophils Relative: 3.6 %
HCT: 41.2 % (ref 35.0–45.0)
Hemoglobin: 13.7 g/dL (ref 11.7–15.5)
Lymphs Abs: 2038 cells/uL (ref 850–3900)
MCH: 29.1 pg (ref 27.0–33.0)
MCHC: 33.3 g/dL (ref 32.0–36.0)
MCV: 87.7 fL (ref 80.0–100.0)
MPV: 13.9 fL — ABNORMAL HIGH (ref 7.5–12.5)
Monocytes Relative: 9.8 %
Neutro Abs: 2402 cells/uL (ref 1500–7800)
Neutrophils Relative %: 46.2 %
Platelets: 224 10*3/uL (ref 140–400)
RBC: 4.7 10*6/uL (ref 3.80–5.10)
RDW: 13.7 % (ref 11.0–15.0)
Total Lymphocyte: 39.2 %
WBC: 5.2 10*3/uL (ref 3.8–10.8)

## 2022-04-19 LAB — SEDIMENTATION RATE: Sed Rate: 34 mm/h — ABNORMAL HIGH (ref 0–20)

## 2022-04-19 NOTE — Progress Notes (Signed)
Lab results look fine for continuing the current hydroxychloroquine 400 mg daily. Her sedimentation rate remains mildly elevated but the same as 6 months ago.

## 2022-07-19 IMAGING — US US BREAST*R* LIMITED INC AXILLA
1 series · 5 of 5 positions shown · non-contrast
Comparison: Screening mammogram dated 07/05/2021.

ACR Breast Density Category 3: Probably benign.

CLINICAL DATA: Screening recall from baseline mammography for right
breast asymmetry.

EXAM:
DIGITAL DIAGNOSTIC UNILATERAL RIGHT MAMMOGRAM WITH TOMOSYNTHESIS AND
CAD; ULTRASOUND RIGHT BREAST LIMITED
TECHNIQUE: Right digital diagnostic mammography and breast tomosynthesis was
performed. The images were evaluated with computer-aided detection.;
Targeted ultrasound examination of the right breast was performed

[Series 1: us breast*right* limited inc axilla · 0.07mm/px · 5 of 5 slices shown]
[im 1/5]
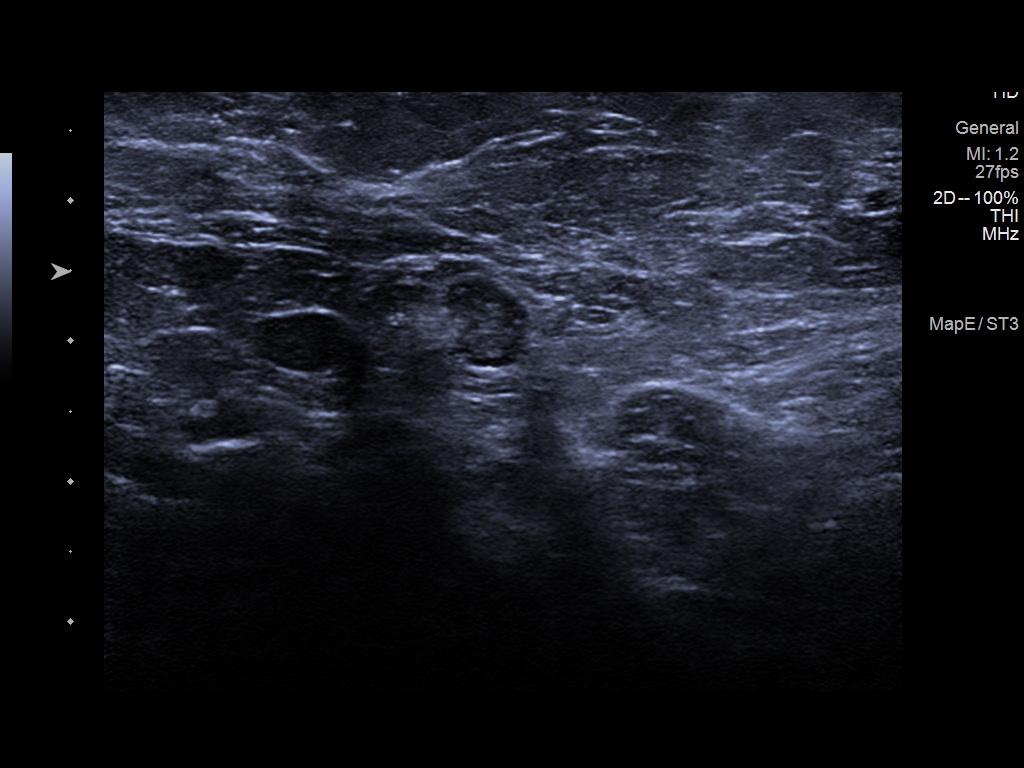
[im 2/5]
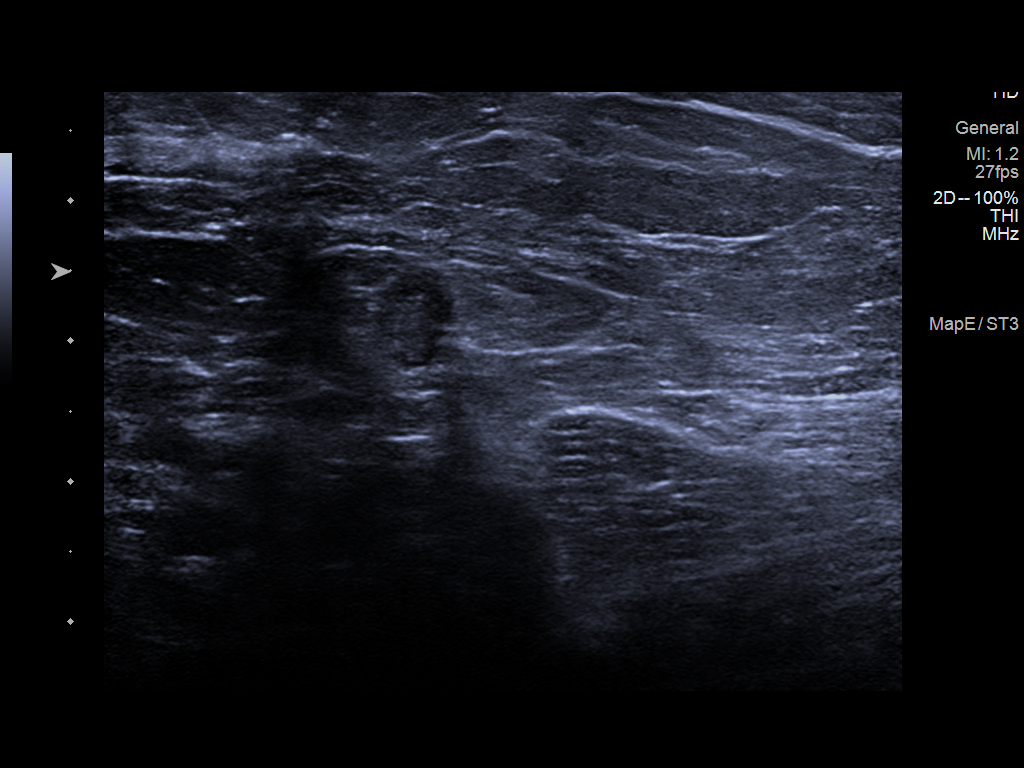
[im 3/5]
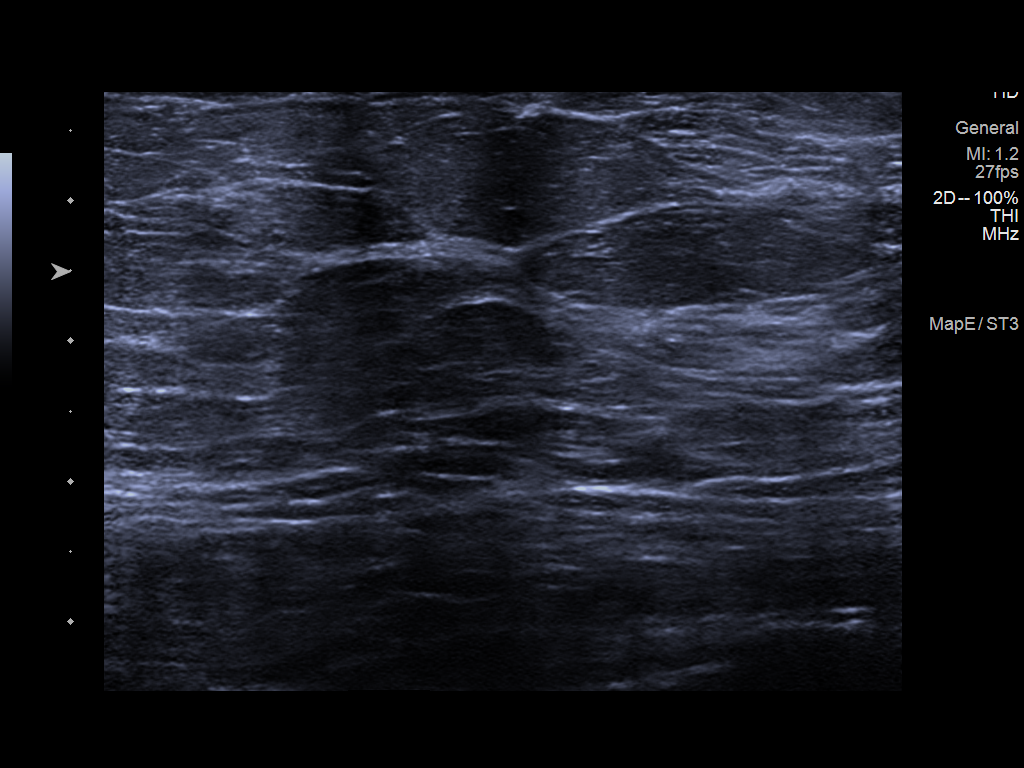
[im 4/5]
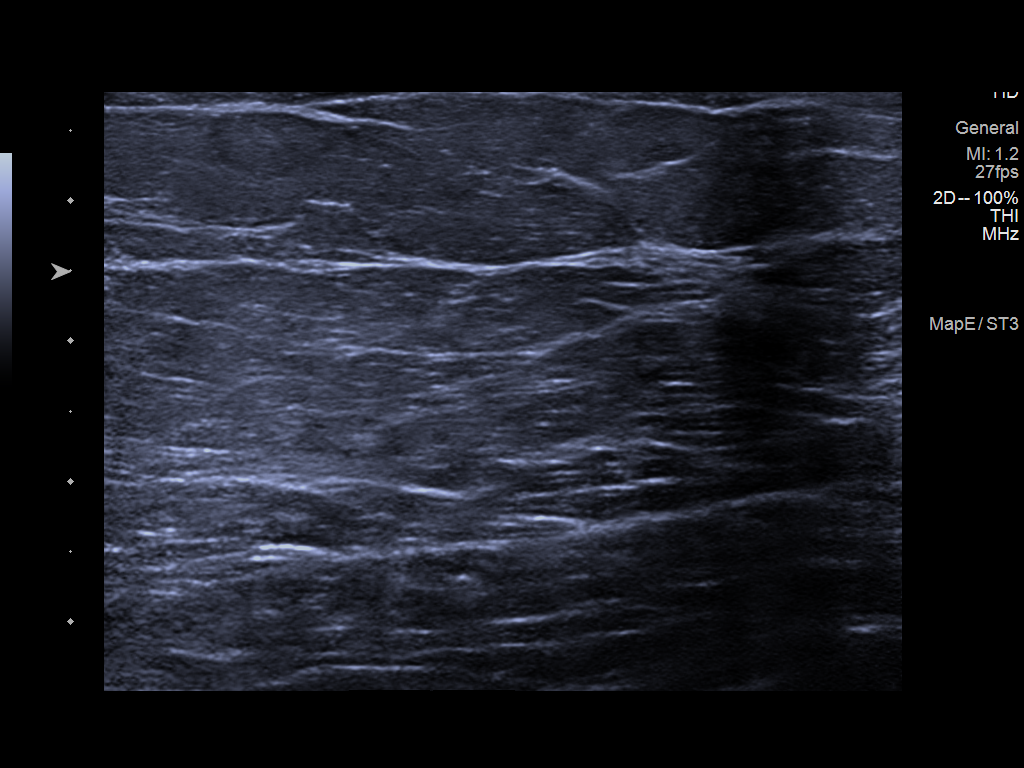
[im 5/5]
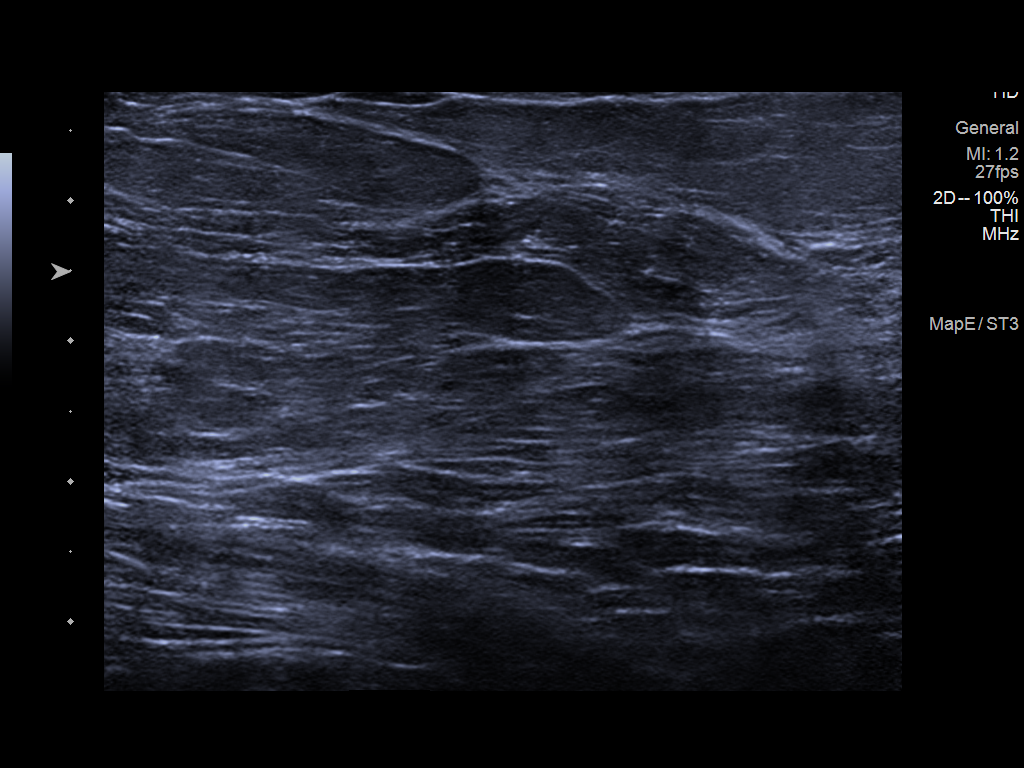

[5 of 5 positions shown; findings below may reference images not displayed]

FINDINGS: Additional tomograms were performed of the right breast. There is a
persistent mass with margin irregularity measuring 1 cm in the
upper-outer right breast.

Targeted ultrasound of the upper-outer right breast was performed
demonstrating dense fibroglandular tissue and generalized shadowing.
There is no definite sonographic correlate for the mass seen in the
upper-outer right breast. No lymphadenopathy seen in the right
axilla.
IMPRESSION: Indeterminate 1 cm mass seen in the right breast on mammography
only.

RECOMMENDATION:
Recommend stereotactic guided biopsy of the mass in the upper-outer
right breast.

I have discussed the findings and recommendations with the patient.
If applicable, a reminder letter will be sent to the patient
regarding the next appointment.

BI-RADS CATEGORY  4: Suspicious.

## 2022-07-19 IMAGING — MG MM DIGITAL DIAGNOSTIC UNILAT*R* W/ TOMO W/ CAD
4 series · 4 of 12 positions shown · non-contrast
Comparison: Screening mammogram dated 07/05/2021.

ACR Breast Density Category 3: Probably benign.

CLINICAL DATA: Screening recall from baseline mammography for right
breast asymmetry.

EXAM:
DIGITAL DIAGNOSTIC UNILATERAL RIGHT MAMMOGRAM WITH TOMOSYNTHESIS AND
CAD; ULTRASOUND RIGHT BREAST LIMITED
TECHNIQUE: Right digital diagnostic mammography and breast tomosynthesis was
performed. The images were evaluated with computer-aided detection.;
Targeted ultrasound examination of the right breast was performed

[R CC synth-2D]
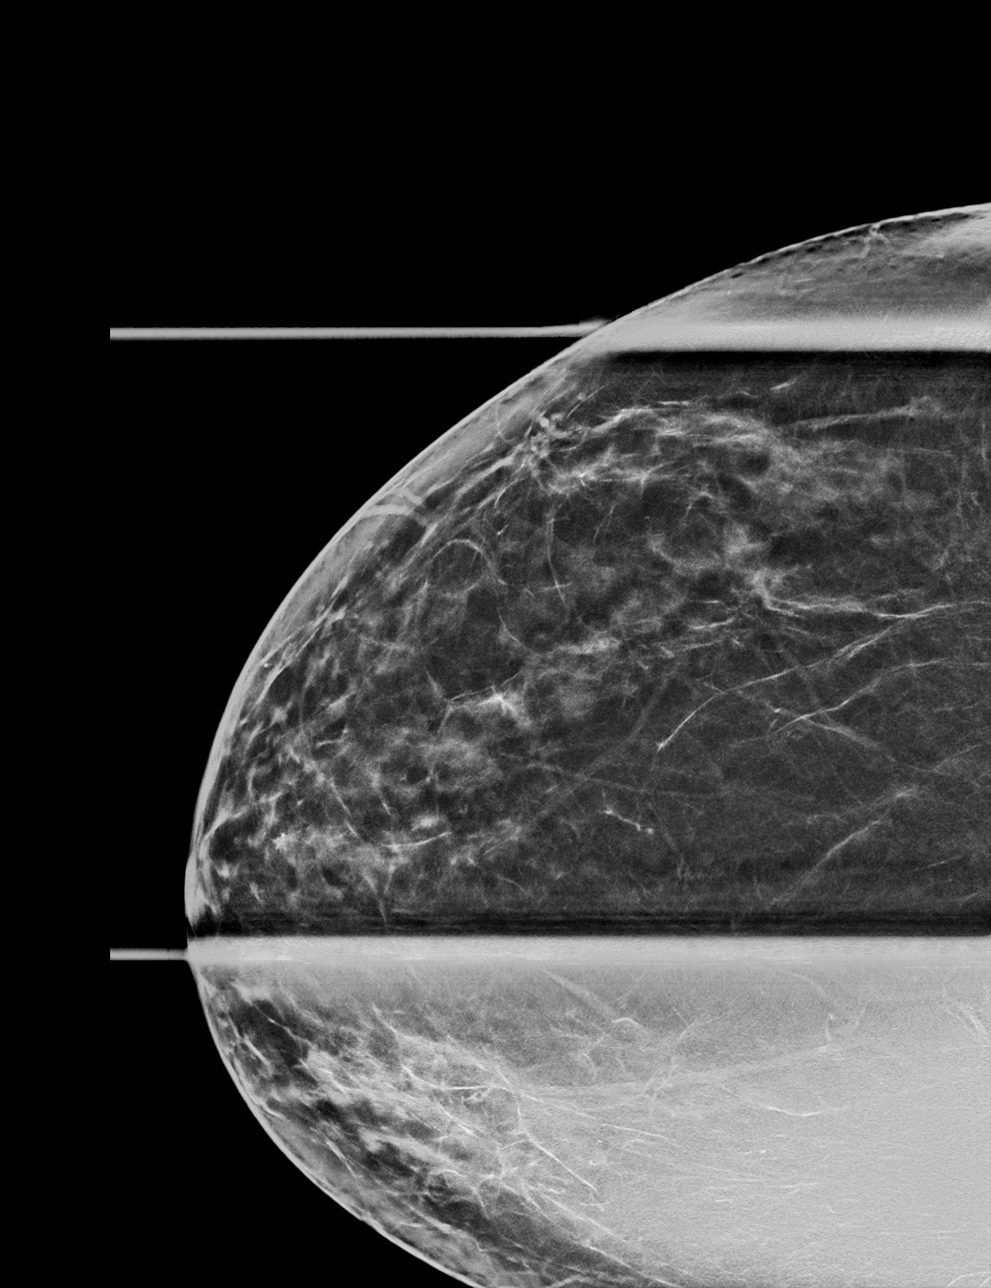

[R MLO synth-2D]
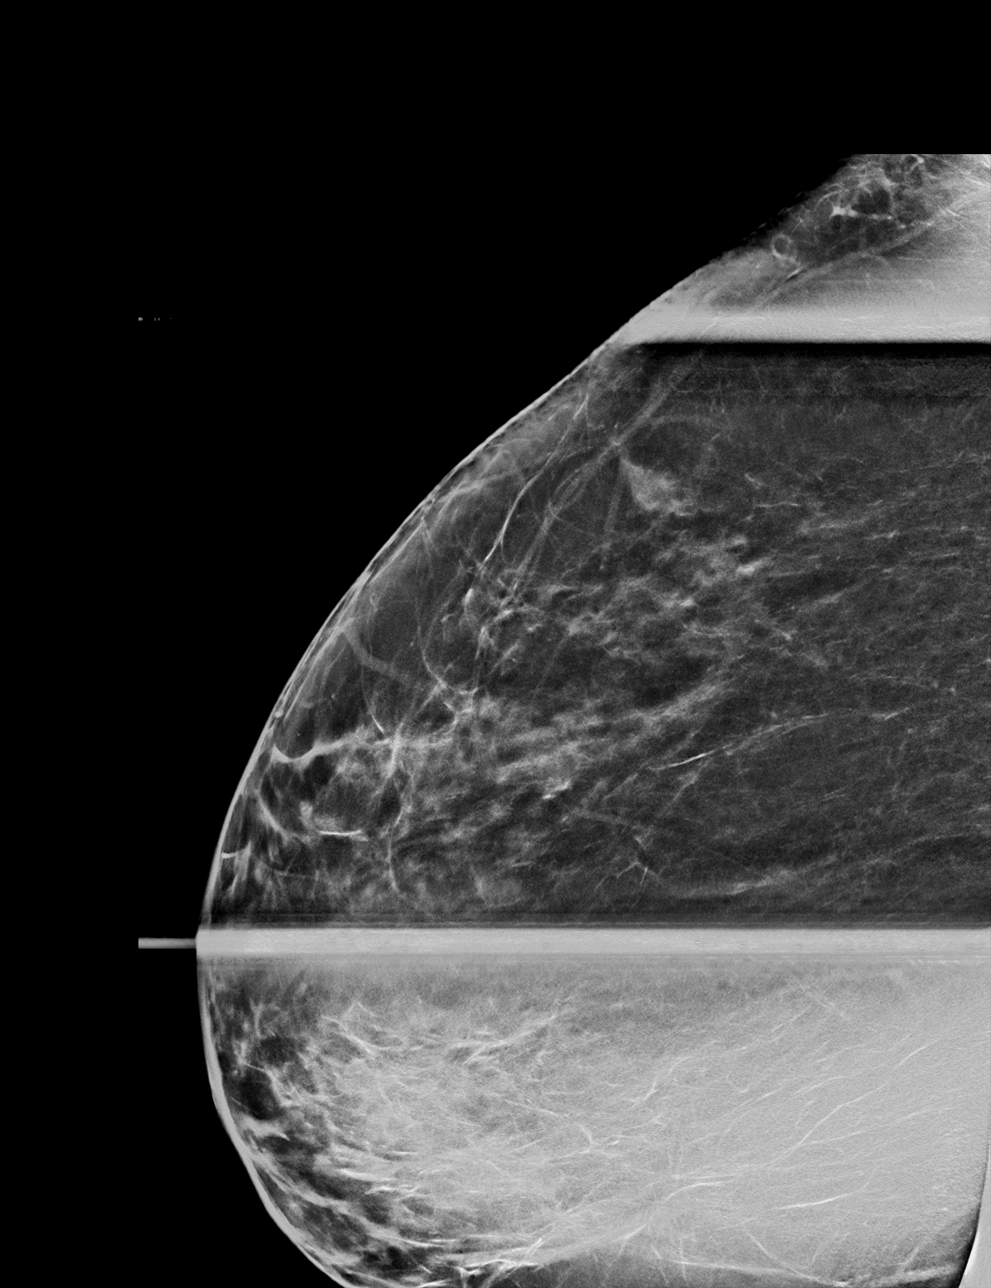

[R CC tomo · tomo slice 33/65.0]
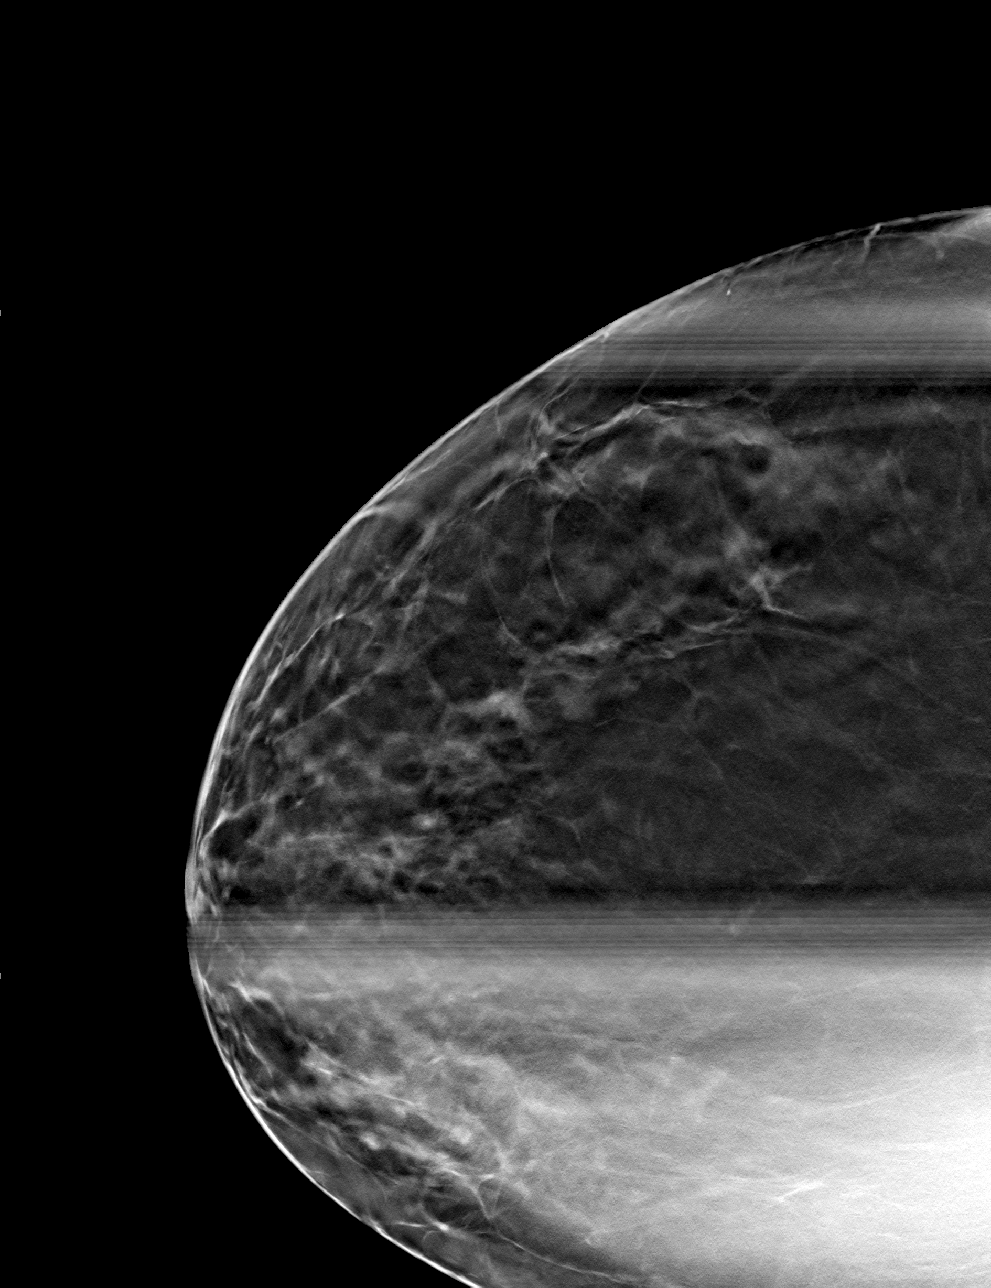

[R MLO tomo · tomo slice 33/65.0]
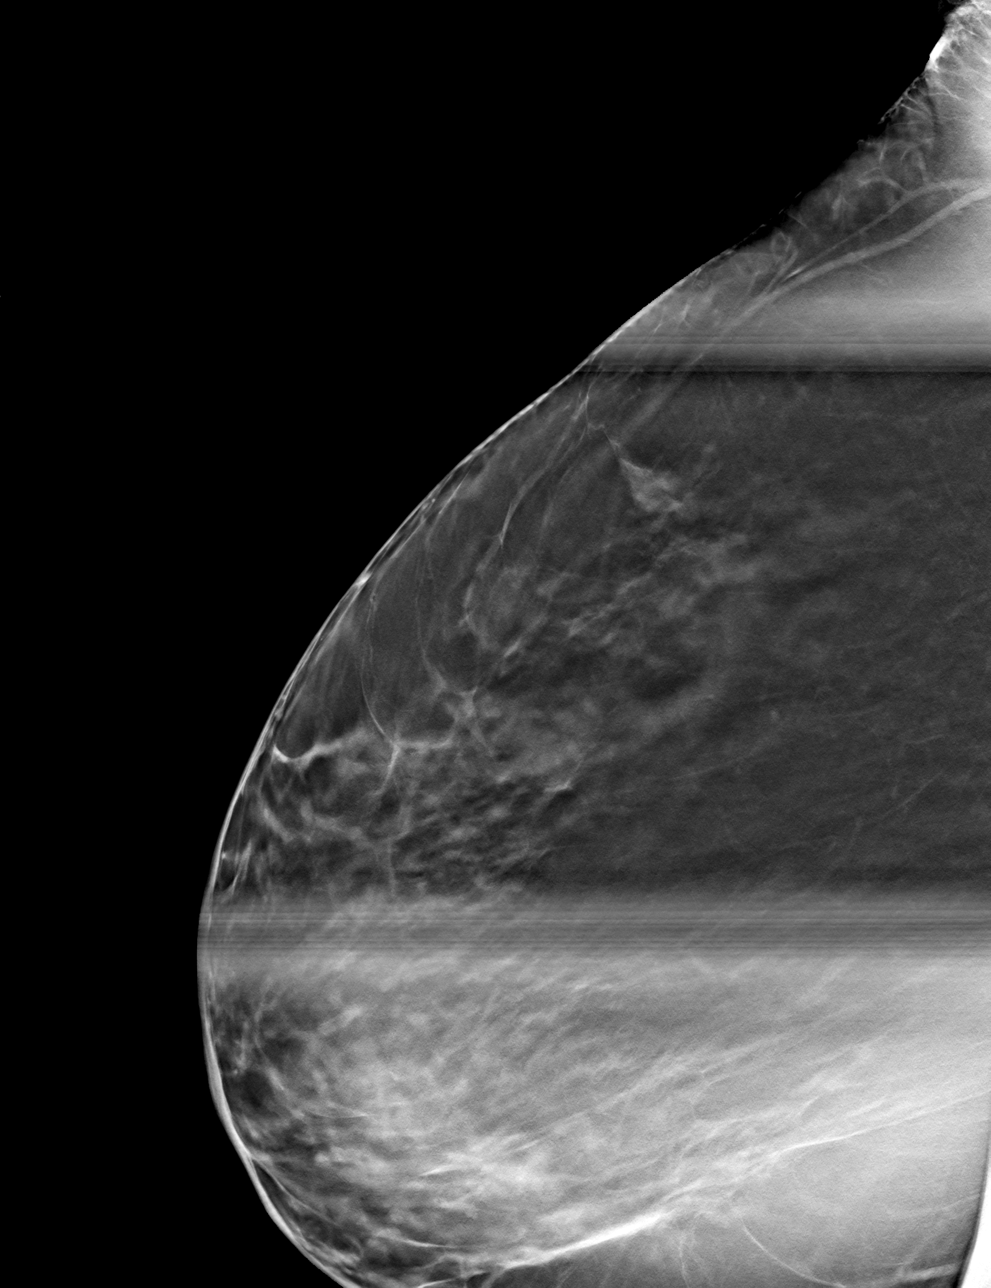

[4 of 12 positions shown; findings below may reference images not displayed]

FINDINGS: Additional tomograms were performed of the right breast. There is a
persistent mass with margin irregularity measuring 1 cm in the
upper-outer right breast.

Targeted ultrasound of the upper-outer right breast was performed
demonstrating dense fibroglandular tissue and generalized shadowing.
There is no definite sonographic correlate for the mass seen in the
upper-outer right breast. No lymphadenopathy seen in the right
axilla.
IMPRESSION: Indeterminate 1 cm mass seen in the right breast on mammography
only.

RECOMMENDATION:
Recommend stereotactic guided biopsy of the mass in the upper-outer
right breast.

I have discussed the findings and recommendations with the patient.
If applicable, a reminder letter will be sent to the patient
regarding the next appointment.

BI-RADS CATEGORY  4: Suspicious.

## 2022-09-15 ENCOUNTER — Other Ambulatory Visit: Payer: Self-pay | Admitting: Internal Medicine

## 2022-09-15 DIAGNOSIS — M0579 Rheumatoid arthritis with rheumatoid factor of multiple sites without organ or systems involvement: Secondary | ICD-10-CM

## 2022-10-17 ENCOUNTER — Ambulatory Visit: Admitting: Internal Medicine

## 2022-10-17 NOTE — Progress Notes (Deleted)
Office Visit Note  PatienLASHAWNDRA Stevens Stevens             Date of Birth: 1971-05-05           MRN: [id number]             PCP: Shawna Spar, MD Referring: Shawna Stevens* Visit Date: 10/17/2022   Subjective:  No chief complaint on file.   History of Present Illness: Shawna Stevens is a 52 y.o. female here for follow up ***   Previous HPI 04/18/22 ???Shawna Stevens is a 52 y.o. 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  Symptoms have been doing well since our last visit.  She continues to notice some fullness or swelling around the face but not particularly increased since last visit.  She was recently treated with ciprofloxacin for a urinary tract infection with symptoms of urinary frequency.  She has had numerous recurrent UTIs and this is most commonly her only symptom.  It improved after a week of antibiotics and she is back on the hydroxychloroquine.  Notices pretty frequent warmth and erythema in her hands sometimes along with hot flashes at night but also occurs intermittently during the day.   Previous HPI 10/17/2021 Shawna Stevens is a 52 y.o. 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 She feels joint pain and stiffness are doing well not much morning stiffness. No particular change in skin rashes but keeps some mild erythema. She has gained additional weight. She thinks there is some increased swelling of saliva glands but no pain or redness or change in mouth dryness.   Previous HPI 04/18/21  Shawna Stevens is a 52 y.o. 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 She visited the ED 03/10/21 with flank pain wrkup at the time showed blood, calcium oxalate crystals on UA but CT renal tone study was negative so took antibiotics for suspected cystitis. She feels well in regards to arthritis no swelling or major pain complaints.  She has been taking the vitamin at increased supplement dose of 3000 units daily. She saw Dr. Tenny Craw for echocardiogram in July which looked good no reduced ejection fraction no increased estimated RVSP.   Previous HPI 07/20/20 Shawna Stevens is a 52 y.o. 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 She is a previous patient of Dr. Corliss Skains. She left this practice due to living for a while in [place] and in [place] but has moved back to this area and reestablishing care. She feels symptoms have been very well controlled on methotrexate hydroxychloroquine. She has persistent left wrist limited movement but otherwise no new joint problems since before. She is not sure about last ophthalmology exam for retinal toxicity screening.     No Rheumatology ROS completed.   PMFS History:  Patient Active Problem List   Diagnosis Date Noted   Peri-menopause 08/08/2021   Encounter for screening fecal occult blood testing 08/08/2021   Routine cervical smear 08/08/2021   Vitamin D deficiency 10/18/2020   Recurrent UTI 12/27/2016   Other fatigue 11/29/2016   Alopecia 11/29/2016   Photosensitivity 11/29/2016   High risk medication use 11/29/2016   Rheumatoid arthritis involving multiple sites with positive rheumatoid factor 10/08/2016   Systemic lupus erythematosus 10/08/2016   History of pulmonary embolism 10/08/2016   Pulmonary embolism 06/03/2013    Past Medical History:  Diagnosis  Date   Lupus (HCC)    Pulmonary embolism (HCC)    Rheumatoid aortitis    Rheumatoid arthritis (HCC)    Sjogren's syndrome (HCC)    Systemic lupus erythematosus (HCC)     Family History  Problem Relation Age of Onset   Alzheimer's disease Paternal Grandmother    Thyroid disease Mother    Past Surgical History:  Procedure Laterality Date   TONSILLECTOMY     TUBAL LIGATION N/A 2006   Social History   Social History Narrative   Not on file    There is no immunization history on file for this patient.    Objective: Vital Signs: There were no vitals taken for this visit.   Physical Exam   Musculoskeletal Exam: ***  CDAI Exam: CDAI Score: -- Patient Global: --; Provider Global: -- Swollen: --; Tender: -- Joint Exam 10/17/2022   No joint exam has been documented for this visit   There is currently no information documented on the homunculus. Go to the Rheumatology activity and complete the homunculus joint exam.  Investigation: No additional findings.  Imaging: No results found.  Recent Labs: Lab Results  Component Value Date   WBC 5.2 04/18/2022   HGB 13.7 04/18/2022   PLT 224 04/18/2022   NA 143 04/18/2022   K 4.2 04/18/2022   CL 108 04/18/2022   CO2 26 04/18/2022   GLUCOSE 86 04/18/2022   BUN 10 04/18/2022   CREATININE 0.95 04/18/2022   BILITOT 0.1 (L) 11/20/2021   ALKPHOS 129 (H) 11/20/2021   AST 24 11/20/2021   ALT 20 11/20/2021   PROT 7.6 11/20/2021   ALBUMIN 3.9 11/20/2021   CALCIUM 9.9 04/18/2022   GFRAA 93 10/18/2020    Speciality Comments: PLQ eye exam- [facility]  No toxicity 09/08/2020, f/u 1 year  [facility] PLQ Eye exam appt? [facility] 09/17/2022  Procedures:  No procedures performed Allergies: Sulfa antibiotics   Assessment / Plan:     Visit Diagnoses: No diagnosis found.  ***  Orders: No orders of the defined types were placed in this encounter.  No orders of the defined types were placed in this encounter.    Follow-Up Instructions: No follow-ups on file.   Fuller Plan, MD  Note - This record has been created using [employer].  Chart creation errors have been sought, but may not always  have been located. Such creation errors do not reflect on  the standard of medical care.

## 2022-11-01 ENCOUNTER — Other Ambulatory Visit: Payer: Self-pay

## 2022-11-01 ENCOUNTER — Telehealth: Payer: Self-pay

## 2022-11-01 ENCOUNTER — Other Ambulatory Visit: Payer: Self-pay | Admitting: *Deleted

## 2022-11-01 DIAGNOSIS — E559 Vitamin D deficiency, unspecified: Secondary | ICD-10-CM

## 2022-11-01 DIAGNOSIS — M0579 Rheumatoid arthritis with rheumatoid factor of multiple sites without organ or systems involvement: Secondary | ICD-10-CM

## 2022-11-01 DIAGNOSIS — Z79899 Other long term (current) drug therapy: Secondary | ICD-10-CM

## 2022-11-01 DIAGNOSIS — M3219 Other organ or system involvement in systemic lupus erythematosus: Secondary | ICD-10-CM

## 2022-11-01 NOTE — Telephone Encounter (Signed)
Patient called back and advised the pharmacy is [facility] [place] at [address] in [place]. Pharmacy number is [phone]. Patient states she needs her Plaquenil filled. Advised patient we do not have an eye exam from last year. Patient states she had an eye exam last year and [facility] was supposed to fax [place] the results. Advised patient we were never faxed the results. Contacted [facility] and Nikki answered the phone. Lowella Bandy states the patient's last eye exam was on 09/12/2021. Lowella Bandy states she will fax eye exam results to [place].

## 2022-11-01 NOTE — Telephone Encounter (Signed)
Patient has moved to [place] and is reestablishing care. Patient states she will not be seen until November. Patient states she would like a refill of [facility]aquenil [facility] to [facility] on [address] in [place].   Last Fill: 04/18/2022  Eye exam: 09/08/2020 No Toxicity. Called patient and patient had another eye exam at [facility] care on 09/12/2021. Contacted [facility] and are waiting on them to fax the results.    Labs: 04/18/2022 Lab results look fine for continuing the current hydroxychloroquine 400 mg daily. Her sedimentation rate remains mildly elevated but the same as 6 months ago.   Next Visit: not scheduled  Last Visit: 04/18/2022   [id number] arthritis involving multiple sites with positive rheumatoid factor   Current Dose per office note 04/18/2022: hydroxychloroquine 400 mg daily   Okay to refill Plaquenil?

## 2022-11-01 NOTE — Telephone Encounter (Signed)
Patient called requesting a referral to Jaymes Graff at [facility]. Address is [address], [place], [place] [address]. Phone: [phone] Fax: [phone]. Patient has moved and was told she needs a referral before scheduling. Once referral is received, first appointment wont be until November and will need a refill on medication Hydroxychloroquine. Patient is unsure of which pharmacy to have medication sent to, Best contact number is [phone] if any questions.

## 2022-11-01 NOTE — Telephone Encounter (Signed)
I called patient, referral placed, patient will call to advise which pharmacy to send RX.

## 2022-11-05 MED ORDER — HYDROXYCHLOROQUINE SULFATE 200 MG PO TABS: 400.0000 mg | ORAL_TABLET | Freq: Every day | ORAL | 0 refills | Status: AC
